# Patient Record
Sex: Female | Born: 1980 | Race: White | Hispanic: No | Marital: Single | Smoking: Current every day smoker
Health system: Southern US, Community
[De-identification: ages and names within clinical notes are randomized; demographics above are authoritative.]

## PROBLEM LIST (undated history)

## (undated) DIAGNOSIS — F319 Bipolar disorder, unspecified: Secondary | ICD-10-CM

## (undated) DIAGNOSIS — R569 Unspecified convulsions: Secondary | ICD-10-CM

## (undated) DIAGNOSIS — F419 Anxiety disorder, unspecified: Secondary | ICD-10-CM

## (undated) DIAGNOSIS — B1920 Unspecified viral hepatitis C without hepatic coma: Secondary | ICD-10-CM

## (undated) HISTORY — PX: TUBAL LIGATION: SHX77

## (undated) HISTORY — PX: TONSILLECTOMY: SUR1361

---
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
## 2000-05-18 ENCOUNTER — Encounter (INDEPENDENT_AMBULATORY_CARE_PROVIDER_SITE_OTHER): Payer: Self-pay | Admitting: *Deleted

## 2000-05-18 ENCOUNTER — Ambulatory Visit (HOSPITAL_BASED_OUTPATIENT_CLINIC_OR_DEPARTMENT_OTHER): Admission: RE | Admit: 2000-05-18 | Discharge: 2000-05-19 | Payer: Self-pay | Admitting: *Deleted

## 2001-03-29 ENCOUNTER — Ambulatory Visit (HOSPITAL_COMMUNITY): Admission: RE | Admit: 2001-03-29 | Discharge: 2001-03-29 | Payer: Self-pay | Admitting: Family Medicine

## 2001-03-29 ENCOUNTER — Encounter: Payer: Self-pay | Admitting: Family Medicine

## 2001-04-27 ENCOUNTER — Other Ambulatory Visit: Admission: RE | Admit: 2001-04-27 | Discharge: 2001-04-27 | Payer: Self-pay | Admitting: *Deleted

## 2001-04-27 ENCOUNTER — Other Ambulatory Visit: Admission: RE | Admit: 2001-04-27 | Discharge: 2001-04-27 | Payer: Self-pay | Admitting: Family Medicine

## 2001-06-29 ENCOUNTER — Encounter: Payer: Self-pay | Admitting: Family Medicine

## 2001-06-29 ENCOUNTER — Ambulatory Visit (HOSPITAL_COMMUNITY): Admission: RE | Admit: 2001-06-29 | Discharge: 2001-06-29 | Payer: Self-pay | Admitting: Family Medicine

## 2001-09-02 ENCOUNTER — Encounter: Payer: Self-pay | Admitting: Family Medicine

## 2001-09-02 ENCOUNTER — Inpatient Hospital Stay (HOSPITAL_COMMUNITY): Admission: AD | Admit: 2001-09-02 | Discharge: 2001-09-04 | Payer: Self-pay | Admitting: Family Medicine

## 2001-09-12 ENCOUNTER — Inpatient Hospital Stay (HOSPITAL_COMMUNITY): Admission: AD | Admit: 2001-09-12 | Discharge: 2001-09-12 | Payer: Self-pay | Admitting: Family Medicine

## 2001-10-07 ENCOUNTER — Encounter: Payer: Self-pay | Admitting: Family Medicine

## 2001-10-07 ENCOUNTER — Inpatient Hospital Stay (HOSPITAL_COMMUNITY): Admission: AD | Admit: 2001-10-07 | Discharge: 2001-10-11 | Payer: Self-pay | Admitting: Family Medicine

## 2001-10-09 ENCOUNTER — Encounter: Payer: Self-pay | Admitting: Obstetrics and Gynecology

## 2001-10-12 ENCOUNTER — Encounter: Admission: RE | Admit: 2001-10-12 | Discharge: 2001-11-11 | Payer: Self-pay | Admitting: Family Medicine

## 2001-11-16 ENCOUNTER — Other Ambulatory Visit: Admission: RE | Admit: 2001-11-16 | Discharge: 2001-11-16 | Payer: Self-pay | Admitting: Family Medicine

## 2002-11-24 ENCOUNTER — Emergency Department (HOSPITAL_COMMUNITY): Admission: EM | Admit: 2002-11-24 | Discharge: 2002-11-24 | Payer: Self-pay | Admitting: Emergency Medicine

## 2003-09-30 ENCOUNTER — Emergency Department (HOSPITAL_COMMUNITY): Admission: EM | Admit: 2003-09-30 | Discharge: 2003-09-30 | Payer: Self-pay | Admitting: Emergency Medicine

## 2003-10-06 ENCOUNTER — Ambulatory Visit (HOSPITAL_COMMUNITY): Admission: RE | Admit: 2003-10-06 | Discharge: 2003-10-06 | Payer: Self-pay | Admitting: Family Medicine

## 2003-10-13 ENCOUNTER — Ambulatory Visit (HOSPITAL_COMMUNITY): Admission: RE | Admit: 2003-10-13 | Discharge: 2003-10-13 | Payer: Self-pay | Admitting: Family Medicine

## 2003-12-11 ENCOUNTER — Other Ambulatory Visit: Admission: RE | Admit: 2003-12-11 | Discharge: 2003-12-11 | Payer: Self-pay | Admitting: Family Medicine

## 2004-01-08 ENCOUNTER — Ambulatory Visit (HOSPITAL_COMMUNITY): Admission: RE | Admit: 2004-01-08 | Discharge: 2004-01-08 | Payer: Self-pay | Admitting: Family Medicine

## 2004-03-25 ENCOUNTER — Inpatient Hospital Stay (HOSPITAL_COMMUNITY): Admission: AD | Admit: 2004-03-25 | Discharge: 2004-03-27 | Payer: Self-pay | Admitting: Family Medicine

## 2004-03-30 ENCOUNTER — Inpatient Hospital Stay (HOSPITAL_COMMUNITY): Admission: AD | Admit: 2004-03-30 | Discharge: 2004-04-08 | Payer: Self-pay | Admitting: Family Medicine

## 2004-06-13 ENCOUNTER — Ambulatory Visit: Payer: Self-pay | Admitting: Obstetrics and Gynecology

## 2004-07-10 ENCOUNTER — Ambulatory Visit: Payer: Self-pay | Admitting: Obstetrics & Gynecology

## 2004-07-10 ENCOUNTER — Ambulatory Visit (HOSPITAL_COMMUNITY): Admission: RE | Admit: 2004-07-10 | Discharge: 2004-07-10 | Payer: Self-pay | Admitting: Obstetrics and Gynecology

## 2004-09-19 ENCOUNTER — Emergency Department (HOSPITAL_COMMUNITY): Admission: EM | Admit: 2004-09-19 | Discharge: 2004-09-19 | Payer: Self-pay | Admitting: *Deleted

## 2004-10-21 ENCOUNTER — Inpatient Hospital Stay (HOSPITAL_COMMUNITY): Admission: EM | Admit: 2004-10-21 | Discharge: 2004-10-22 | Payer: Self-pay | Admitting: Emergency Medicine

## 2004-11-21 ENCOUNTER — Emergency Department (HOSPITAL_COMMUNITY): Admission: EM | Admit: 2004-11-21 | Discharge: 2004-11-21 | Payer: Self-pay | Admitting: Emergency Medicine

## 2004-12-28 ENCOUNTER — Emergency Department (HOSPITAL_COMMUNITY): Admission: EM | Admit: 2004-12-28 | Discharge: 2004-12-28 | Payer: Self-pay | Admitting: Emergency Medicine

## 2005-02-19 ENCOUNTER — Other Ambulatory Visit: Admission: RE | Admit: 2005-02-19 | Discharge: 2005-02-19 | Payer: Self-pay | Admitting: Obstetrics and Gynecology

## 2005-06-14 ENCOUNTER — Emergency Department (HOSPITAL_COMMUNITY): Admission: EM | Admit: 2005-06-14 | Discharge: 2005-06-14 | Payer: Self-pay | Admitting: Emergency Medicine

## 2005-06-16 ENCOUNTER — Emergency Department (HOSPITAL_COMMUNITY): Admission: EM | Admit: 2005-06-16 | Discharge: 2005-06-16 | Payer: Self-pay | Admitting: Emergency Medicine

## 2005-06-17 ENCOUNTER — Ambulatory Visit (HOSPITAL_COMMUNITY): Admission: RE | Admit: 2005-06-17 | Discharge: 2005-06-17 | Payer: Self-pay | Admitting: *Deleted

## 2005-06-17 ENCOUNTER — Ambulatory Visit: Payer: Self-pay | Admitting: Family Medicine

## 2005-06-17 ENCOUNTER — Inpatient Hospital Stay (HOSPITAL_COMMUNITY): Admission: EM | Admit: 2005-06-17 | Discharge: 2005-06-19 | Payer: Self-pay | Admitting: *Deleted

## 2005-12-07 ENCOUNTER — Inpatient Hospital Stay (HOSPITAL_COMMUNITY): Admission: EM | Admit: 2005-12-07 | Discharge: 2005-12-10 | Payer: Self-pay | Admitting: Emergency Medicine

## 2006-03-28 ENCOUNTER — Emergency Department (HOSPITAL_COMMUNITY): Admission: EM | Admit: 2006-03-28 | Discharge: 2006-03-28 | Payer: Self-pay | Admitting: Emergency Medicine

## 2006-03-29 ENCOUNTER — Emergency Department (HOSPITAL_COMMUNITY): Admission: EM | Admit: 2006-03-29 | Discharge: 2006-03-29 | Payer: Self-pay | Admitting: Emergency Medicine

## 2006-04-27 ENCOUNTER — Ambulatory Visit: Payer: Self-pay | Admitting: Psychiatry

## 2006-04-27 ENCOUNTER — Inpatient Hospital Stay (HOSPITAL_COMMUNITY): Admission: RE | Admit: 2006-04-27 | Discharge: 2006-04-30 | Payer: Self-pay | Admitting: Psychiatry

## 2006-10-30 ENCOUNTER — Inpatient Hospital Stay (HOSPITAL_COMMUNITY): Admission: EM | Admit: 2006-10-30 | Discharge: 2006-11-01 | Payer: Self-pay | Admitting: Emergency Medicine

## 2007-02-12 ENCOUNTER — Emergency Department (HOSPITAL_COMMUNITY): Admission: EM | Admit: 2007-02-12 | Discharge: 2007-02-12 | Payer: Self-pay | Admitting: Emergency Medicine

## 2007-03-12 ENCOUNTER — Emergency Department (HOSPITAL_COMMUNITY): Admission: EM | Admit: 2007-03-12 | Discharge: 2007-03-13 | Payer: Self-pay | Admitting: Emergency Medicine

## 2007-04-11 ENCOUNTER — Emergency Department (HOSPITAL_COMMUNITY): Admission: EM | Admit: 2007-04-11 | Discharge: 2007-04-11 | Payer: Self-pay | Admitting: Emergency Medicine

## 2008-02-27 ENCOUNTER — Inpatient Hospital Stay (HOSPITAL_COMMUNITY): Admission: EM | Admit: 2008-02-27 | Discharge: 2008-03-02 | Payer: Self-pay | Admitting: *Deleted

## 2008-03-27 ENCOUNTER — Emergency Department (HOSPITAL_COMMUNITY): Admission: EM | Admit: 2008-03-27 | Discharge: 2008-03-27 | Payer: Self-pay | Admitting: Emergency Medicine

## 2008-04-21 ENCOUNTER — Emergency Department (HOSPITAL_COMMUNITY): Admission: EM | Admit: 2008-04-21 | Discharge: 2008-04-21 | Payer: Self-pay | Admitting: Emergency Medicine

## 2008-05-20 ENCOUNTER — Emergency Department (HOSPITAL_COMMUNITY): Admission: EM | Admit: 2008-05-20 | Discharge: 2008-05-20 | Payer: Self-pay | Admitting: Emergency Medicine

## 2008-07-28 ENCOUNTER — Emergency Department (HOSPITAL_COMMUNITY): Admission: EM | Admit: 2008-07-28 | Discharge: 2008-07-28 | Payer: Self-pay | Admitting: Emergency Medicine

## 2008-12-17 ENCOUNTER — Inpatient Hospital Stay (HOSPITAL_COMMUNITY): Admission: EM | Admit: 2008-12-17 | Discharge: 2008-12-18 | Payer: Self-pay | Admitting: Emergency Medicine

## 2009-02-14 ENCOUNTER — Emergency Department (HOSPITAL_COMMUNITY): Admission: EM | Admit: 2009-02-14 | Discharge: 2009-02-14 | Payer: Self-pay | Admitting: Emergency Medicine

## 2009-05-24 ENCOUNTER — Inpatient Hospital Stay (HOSPITAL_COMMUNITY): Admission: EM | Admit: 2009-05-24 | Discharge: 2009-05-25 | Payer: Self-pay | Admitting: Emergency Medicine

## 2010-03-01 ENCOUNTER — Emergency Department (HOSPITAL_COMMUNITY): Admission: EM | Admit: 2010-03-01 | Discharge: 2010-03-02 | Payer: Self-pay | Admitting: Emergency Medicine

## 2010-05-14 ENCOUNTER — Emergency Department (HOSPITAL_COMMUNITY)
Admission: EM | Admit: 2010-05-14 | Discharge: 2010-05-14 | Payer: Self-pay | Source: Home / Self Care | Admitting: Emergency Medicine

## 2010-05-15 ENCOUNTER — Ambulatory Visit: Payer: Self-pay | Admitting: Psychiatry

## 2010-05-15 ENCOUNTER — Inpatient Hospital Stay (HOSPITAL_COMMUNITY)
Admission: AD | Admit: 2010-05-15 | Discharge: 2010-05-19 | Payer: Self-pay | Source: Home / Self Care | Admitting: Psychiatry

## 2010-05-15 ENCOUNTER — Emergency Department (HOSPITAL_COMMUNITY): Admission: EM | Admit: 2010-05-15 | Discharge: 2010-05-15 | Payer: Self-pay | Admitting: Emergency Medicine

## 2010-05-15 ENCOUNTER — Ambulatory Visit (HOSPITAL_COMMUNITY)
Admission: RE | Admit: 2010-05-15 | Discharge: 2010-05-15 | Payer: Self-pay | Source: Home / Self Care | Admitting: Psychiatry

## 2010-05-15 DIAGNOSIS — F411 Generalized anxiety disorder: Secondary | ICD-10-CM

## 2010-05-15 DIAGNOSIS — F1910 Other psychoactive substance abuse, uncomplicated: Secondary | ICD-10-CM

## 2010-09-03 LAB — DIFFERENTIAL
Basophils Absolute: 0 10*3/uL (ref 0.0–0.1)
Basophils Relative: 0 % (ref 0–1)
Eosinophils Absolute: 0.1 10*3/uL (ref 0.0–0.7)
Eosinophils Relative: 1 % (ref 0–5)

## 2010-09-03 LAB — URINALYSIS, ROUTINE W REFLEX MICROSCOPIC
Hgb urine dipstick: NEGATIVE
Protein, ur: NEGATIVE mg/dL
Urobilinogen, UA: 1 mg/dL (ref 0.0–1.0)

## 2010-09-03 LAB — CBC
Hemoglobin: 13.1 g/dL (ref 12.0–15.0)
MCH: 34.9 pg — ABNORMAL HIGH (ref 26.0–34.0)
RBC: 3.75 MIL/uL — ABNORMAL LOW (ref 3.87–5.11)

## 2010-09-03 LAB — COMPREHENSIVE METABOLIC PANEL
ALT: 43 U/L — ABNORMAL HIGH (ref 0–35)
AST: 32 U/L (ref 0–37)
Alkaline Phosphatase: 34 U/L — ABNORMAL LOW (ref 39–117)
CO2: 25 mEq/L (ref 19–32)
Chloride: 103 mEq/L (ref 96–112)
Creatinine, Ser: 0.88 mg/dL (ref 0.4–1.2)
GFR calc Af Amer: >60 mL/min (ref >60)
GFR calc non Af Amer: >60 mL/min (ref >60)
Sodium: 139 mEq/L (ref 135–145)
Total Bilirubin: 0.6 mg/dL (ref 0.3–1.2)

## 2010-09-03 LAB — URINE MICROSCOPIC-ADD ON

## 2010-09-03 LAB — RAPID URINE DRUG SCREEN, HOSP PERFORMED
Amphetamines: NOT DETECTED
Benzodiazepines: POSITIVE — AB
Tetrahydrocannabinol: POSITIVE — AB

## 2010-09-03 LAB — ACETAMINOPHEN LEVEL: Acetaminophen (Tylenol), Serum: <10 ug/mL — ABNORMAL LOW (ref 10–30)

## 2010-09-05 LAB — CBC
MCH: 34.7 pg — ABNORMAL HIGH (ref 26.0–34.0)
MCV: 98.7 fL (ref 78.0–100.0)
Platelets: 195 10*3/uL (ref 150–400)
RDW: 13.8 % (ref 11.5–15.5)

## 2010-09-05 LAB — BASIC METABOLIC PANEL
BUN: 17 mg/dL (ref 6–23)
CO2: 27 mEq/L (ref 19–32)
Chloride: 106 mEq/L (ref 96–112)
Creatinine, Ser: 0.82 mg/dL (ref 0.4–1.2)

## 2010-09-05 LAB — DIFFERENTIAL
Eosinophils Absolute: 0.2 10*3/uL (ref 0.0–0.7)
Eosinophils Relative: 2 % (ref 0–5)
Lymphs Abs: 2.6 10*3/uL (ref 0.7–4.0)
Monocytes Absolute: 0.9 10*3/uL (ref 0.1–1.0)

## 2010-09-24 LAB — CBC
HCT: 39.2 % (ref 36.0–46.0)
HCT: 40 % (ref 36.0–46.0)
HCT: 44 % (ref 36.0–46.0)
Hemoglobin: 13.8 g/dL (ref 12.0–15.0)
MCHC: 34 g/dL (ref 30.0–36.0)
MCV: 100.6 fL — ABNORMAL HIGH (ref 78.0–100.0)
MCV: 101.2 fL — ABNORMAL HIGH (ref 78.0–100.0)
Platelets: 149 10*3/uL — ABNORMAL LOW (ref 150–400)
Platelets: 149 10*3/uL — ABNORMAL LOW (ref 150–400)
RBC: 3.95 MIL/uL (ref 3.87–5.11)
RDW: 12.8 % (ref 11.5–15.5)
RDW: 13.1 % (ref 11.5–15.5)
WBC: 10.5 10*3/uL (ref 4.0–10.5)

## 2010-09-24 LAB — COMPREHENSIVE METABOLIC PANEL
Albumin: 3.7 g/dL (ref 3.5–5.2)
Alkaline Phosphatase: 35 U/L — ABNORMAL LOW (ref 39–117)
Alkaline Phosphatase: 36 U/L — ABNORMAL LOW (ref 39–117)
BUN: 5 mg/dL — ABNORMAL LOW (ref 6–23)
BUN: 9 mg/dL (ref 6–23)
CO2: 18 mEq/L — ABNORMAL LOW (ref 19–32)
Chloride: 111 mEq/L (ref 96–112)
Creatinine, Ser: 0.75 mg/dL (ref 0.4–1.2)
Creatinine, Ser: 0.77 mg/dL (ref 0.4–1.2)
GFR calc non Af Amer: >60 mL/min (ref >60)
Potassium: 3.7 mEq/L (ref 3.5–5.1)
Total Bilirubin: 1 mg/dL (ref 0.3–1.2)
Total Protein: 6.7 g/dL (ref 6.0–8.3)

## 2010-09-24 LAB — DIFFERENTIAL
Basophils Absolute: 0 10*3/uL (ref 0.0–0.1)
Basophils Relative: 0 % (ref 0–1)
Eosinophils Absolute: 0 10*3/uL (ref 0.0–0.7)
Eosinophils Relative: 1 % (ref 0–5)
Eosinophils Relative: 1 % (ref 0–5)
Lymphocytes Relative: 18 % (ref 12–46)
Lymphocytes Relative: 35 % (ref 12–46)
Lymphs Abs: 1.6 10*3/uL (ref 0.7–4.0)
Lymphs Abs: 2.3 10*3/uL (ref 0.7–4.0)
Monocytes Absolute: 0.6 10*3/uL (ref 0.1–1.0)
Monocytes Relative: 10 % (ref 3–12)
Neutro Abs: 3.4 10*3/uL (ref 1.7–7.7)
Neutro Abs: 7.6 10*3/uL (ref 1.7–7.7)

## 2010-09-24 LAB — PREGNANCY, URINE: Preg Test, Ur: NEGATIVE

## 2010-09-24 LAB — URINE MICROSCOPIC-ADD ON

## 2010-09-24 LAB — URINALYSIS, ROUTINE W REFLEX MICROSCOPIC
Nitrite: POSITIVE — AB
Specific Gravity, Urine: <1.005 — ABNORMAL LOW (ref 1.005–1.030)
Urobilinogen, UA: 0.2 mg/dL (ref 0.0–1.0)
pH: 5.5 (ref 5.0–8.0)

## 2010-09-24 LAB — RAPID URINE DRUG SCREEN, HOSP PERFORMED
Amphetamines: POSITIVE — AB
Barbiturates: NOT DETECTED

## 2010-09-24 LAB — BASIC METABOLIC PANEL
BUN: 10 mg/dL (ref 6–23)
CO2: 25 mEq/L (ref 19–32)
Chloride: 102 mEq/L (ref 96–112)
Glucose, Bld: 84 mg/dL (ref 70–99)
Potassium: 3.1 mEq/L — ABNORMAL LOW (ref 3.5–5.1)

## 2010-09-24 LAB — SALICYLATE LEVEL: Salicylate Lvl: <4 mg/dL (ref 2.8–20.0)

## 2010-09-30 LAB — DIFFERENTIAL
Eosinophils Absolute: 0.2 10*3/uL (ref 0.0–0.7)
Lymphocytes Relative: 17 % (ref 12–46)
Lymphs Abs: 3.1 10*3/uL (ref 0.7–4.0)
Monocytes Relative: 7 % (ref 3–12)
Neutro Abs: 13.1 10*3/uL — ABNORMAL HIGH (ref 1.7–7.7)
Neutrophils Relative %: 75 % (ref 43–77)

## 2010-09-30 LAB — CBC
MCV: 96.5 fL (ref 78.0–100.0)
Platelets: 184 10*3/uL (ref 150–400)
RBC: 4.26 MIL/uL (ref 3.87–5.11)
WBC: 17.6 10*3/uL — ABNORMAL HIGH (ref 4.0–10.5)

## 2010-09-30 LAB — BASIC METABOLIC PANEL
Chloride: 107 mEq/L (ref 96–112)
Creatinine, Ser: 0.61 mg/dL (ref 0.4–1.2)
GFR calc Af Amer: >60 mL/min (ref >60)
GFR calc non Af Amer: >60 mL/min (ref >60)
Potassium: 3.9 mEq/L (ref 3.5–5.1)

## 2010-10-31 ENCOUNTER — Emergency Department (HOSPITAL_COMMUNITY): Payer: Self-pay

## 2010-10-31 ENCOUNTER — Emergency Department (HOSPITAL_COMMUNITY)
Admission: EM | Admit: 2010-10-31 | Discharge: 2010-10-31 | Disposition: A | Discharge status: Home / Self Care | Payer: Self-pay | Attending: Emergency Medicine | Admitting: Emergency Medicine

## 2010-10-31 DIAGNOSIS — S9030XA Contusion of unspecified foot, initial encounter: Secondary | ICD-10-CM | POA: Insufficient documentation

## 2010-10-31 DIAGNOSIS — L02619 Cutaneous abscess of unspecified foot: Secondary | ICD-10-CM | POA: Insufficient documentation

## 2010-10-31 DIAGNOSIS — Y92009 Unspecified place in unspecified non-institutional (private) residence as the place of occurrence of the external cause: Secondary | ICD-10-CM | POA: Insufficient documentation

## 2010-10-31 DIAGNOSIS — L03119 Cellulitis of unspecified part of limb: Secondary | ICD-10-CM | POA: Insufficient documentation

## 2010-10-31 DIAGNOSIS — IMO0002 Reserved for concepts with insufficient information to code with codable children: Secondary | ICD-10-CM | POA: Insufficient documentation

## 2010-11-05 NOTE — H&P (Signed)
NAME:  [name], Soila                  ACCOUNT NO.:  [id number]   MEDICAL RECORD NO.:  [id number]          PATIENT TYPE:  INP   LOCATION:  5037                         FACILITY:  [facility]   PHYSICIAN:  Lonia Blood, M.D.      DATE OF BIRTH:  05-13-1981   DATE OF ADMISSION:  10/29/2006  DATE OF DISCHARGE:                              HISTORY & PHYSICAL   PRIMARY CARE PHYSICIAN:  Patient is unassigned.  Her primary care  physician is Dr. Bunnie Pion at [place], [place].   PRESENTING COMPLAINT:  Pain and swelling in the left groin area.   HISTORY OF PRESENT ILLNESS:  Patient is a 30 year old female with prior  history of MRSA, cellulitis and abscess involving multiple locations.  Her last admission here was in June 2007 where she had a MRSA abscess to  her chin that was excised and drained.  It was, at the time, thought to  be community-acquired MRSA.  Patient returned today secondary to  recurrent swelling, redness and pain in her left groin area just above  her vulva.  She was seen in the ED and evaluated.  A bedside ultrasound  showed no abscess to be drained.  She, however, has evidence of another  cellulitis, hence she has been admitted.  Patient did have some fever  and mild chills at home but not currently.  Denied any shaving or any  insect bite in the area.   PAST MEDICAL HISTORY:  Significant for bipolar disorder, history of  polysubstance abuse status post detoxification in December 2007.  Tobacco abuse and history of prior MRSA abscesses.   ALLERGIES:  NO KNOWN DRUG ALLERGIES.   MEDICATIONS:  Include Seroquel 300 mg p.o. nightly, Requip 1 mg p.o.  nightly, Cymbalta 60 mg p.o. nightly, hydrocodone 5/500 mg one tablet  q.6 h. p.r.n.   SOCIAL HISTORY:  Patient lives in [name].  She has one child, smokes  1 pack per day, social alcohol, denied any IV drug use now since her  detoxification.   FAMILY HISTORY:  Patient had a sister that died from drug overdose nine  years ago but no history of diabetes or hypertension.   REVIEW OF SYSTEMS:  A 12-point review of systems was performed and is  negative except for HPI.   PHYSICAL EXAMINATION:  VITAL SIGNS:  On exam temperature 97.9, blood  pressure 119/77, pulse 88, respiratory rate 18, sats 99% on room air.  GENERAL:  She is awake, alert, oriented, in no acute distress.  Very  pleasant woman.  HEENT:  PERRL, EOMI.  NECK:  Is supple.  No JVD.  No lymphadenopathy.  RESPIRATORY:  Good air entry bilaterally.  No wheezes.  No rales.  CARDIOVASCULAR SYSTEM:  Shows S1 and S2.  No murmurs.  ABDOMEN:  Soft, nontender.  Positive bowel sounds.  EXTREMITIES:  No edema, cyanosis or clubbing.  GENITOURINARY:  Patient's left groin area showed a 5 x 6 cm induration,  red, tender with surrounding redness in the groin.  No evidence of  abscess.  No fluctuance.   LABORATORY DATA:  White count 11,  hemoglobin 11.6, platelet count 206  with no left shift.   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   PLAN:  1. Left groin cellulitis.  We will admit the patient and start IV      vancomycin.  At this point she has no fluctuance and no evidence of      an abscess, but if it develops along the line, we will proceed to      get I&D done.  We will get blood cultures.  If it is thought to be      MRSA again, if it is community-acquired MRSA, we will discharge her      home on oral antibiotics in the next few days.  2. Bipolar disorder.  We will continue with her home medications      without change.  3. Tobacco abuse.  I will get her tobacco cessation counseling as well      as nicotine patch.  4. Prior history of polysubstance abuse.  Check urine drug screen      again.  The patient was said to have been clean.  5. Normocytic anemia.   This is mild in a young menstruating female.      Probably normal for age.      Lonia Blood, M.D.  Electronically Signed     LG/MEDQ  D:  10/30/2006  T:  10/30/2006  Job:  [id number]

## 2010-11-05 NOTE — Discharge Summary (Signed)
NAME:  [name], BRILL                  ACCOUNT NO.:  [id number]   MEDICAL RECORD NO.:  [id number]          PATIENT TYPE:  INP   LOCATION:  5037                         FACILITY:  [facility]   PHYSICIAN:  Lonia Blood, M.D.       DATE OF BIRTH:  04-14-1981   DATE OF ADMISSION:  10/29/2006  DATE OF DISCHARGE:  11/01/2006                               DISCHARGE SUMMARY   PRIMARY CARE PHYSICIAN:  Dr. Charm Barges in [place], [place], which  makes the patient unassigned for [facility].   DISCHARGE DIAGNOSES:  1. Methicillin-resistant Staphylococcus aureus cellulitis of the left      groin and left labia major -- improving.  2. History of polysubstance abuse.  3. Tobacco abuse.  4. Bipolar disorder.   DISCHARGE MEDICATIONS:  1. Seroquel 300 mg at bedtime.  2. Requip 1 mg at bedtime.  3. Cymbalta 60 mg at bedtime.  4. Septra 2 tablets double strength twice a day for 1 week.  5. Ibuprofen 400 mg 2 times a day.  6. Tylenol 500 mg 3 times a day.  7. Dilaudid 2 mg every 8 hrs as needed for severe pain   CONDITION ON DISCHARGE:  The patient was discharged in good condition.  At the time of discharge the patient was instructed to follow up with  her primary care physician for fcheck up on healing of the abscess.   PROCEDURES DURING THIS ADMISSION:  Soft tissue ultrasound was performed  in the emergency room of the area where the cellulitis was with no  finding of fluid underneath the skin.   CONSULTATIONS:  No consultations were obtained.   HISTORY AND PHYSICAL:  For dictated history and physical, refer to  dictation done by Dr. Mikeal Hawthorne, Oct 30, 2006.   HOSPITAL COURSE:  Problem #1:  Methicillin-resistant Staphylococcus  aureus cellulitis of the left groin and left labia major.  The patient  was admitted to [facility].  She had blood cultures and would  culture obtained.  The blood cultures were negative.  The wound culture  grew Methicillin-resistant Staphylococcus  aureus with a pattern of  resistance suggesting community acquired MRSA.  The patient was treated  with intravenous vancomycin from May 9 until Oct 31, 2006, and then was  switched to oral Septra.  She continued to improve on oral antibiotics  and we made the decision to discharge her to home on 11/01/2006 to  follow up with her primary care physician.   Problem #2:  Bipolar disorder.  The patient has been euthymic during  this hospitalization on her Seroquel, Requip and Cymbalta.      Lonia Blood, M.D.  Electronically Signed     SL/MEDQ  D:  11/01/2006  T:  11/01/2006  Job:  [id number]

## 2010-11-05 NOTE — Discharge Summary (Signed)
NAME:  [name], [name]                  ACCOUNT NO.:  [id number]   MEDICAL RECORD NO.:  [id number]          PATIENT TYPE:  INP   LOCATION:  5148                         FACILITY:  [facility]   PHYSICIAN:  Hillery Aldo, M.D.   DATE OF BIRTH:  Oct 03, 1980   DATE OF ADMISSION:  02/27/2008  DATE OF DISCHARGE:  03/02/2008                               DISCHARGE SUMMARY   PRIMARY CARE PHYSICIAN:  None.   DISCHARGE DIAGNOSES:  1. Periorbital cellulitis, cultures positive for methicillin-sensitive      Staphylococcus aureus.  2. Cocaine abuse.  3. Tobacco abuse.  4. Macrocytosis.   DISCHARGE MEDICATIONS:  1. Doxycycline 100 mg b.i.d. x10 days.  2. Percocet 1-2 tablets q.6 h. p.r.n. pain #20 written for.   CONSULTATION:  Pasty Spillers. Maple Hudson, MD of Ophthalmology.   BRIEF ADMISSION HISTORY OF PRESENT ILLNESS:  The patient is a 30-year-  old female with a history of MRSA in the past, who presented to the  hospital with a chief complaint of left eye periorbital swelling after  plucking her eyebrows.  She also reported some chills and was therefore  admitted for further evaluation and workup.  For full details, please  see the dictated report done by Dr. Ardyth Harps.   PROCEDURES AND DIAGNOSTIC STUDIES:  CT scan of the orbits/temporal area  with contrast on February 27, 2008 showed left-sided  periorbital/preseptal cellulitis with small abscesses.  No  postseptal/orbital involvement.  There was associated surrounding  cellulitis extending up into the forehead area.  Scattered ethmoid sinus  disease.   DISCHARGE LABORATORY VALUES:  Final culture of abscess revealed  Staphylococcus aureus, oxacillin sensitive.  Blood cultures were  negative.  Urine drug screen was positive for cocaine and opiates.   HOSPITAL COURSE:  1. Left periorbital cellulitis:  The patient was seen and consultation      arranged with Dr. Maple Hudson of Ophthalmology.  Dr. Maple Hudson did perform a      bedside incision and drainage, put a  small drain in the indurated      area above her left eye.  Cultures were sent with findings as noted      above.  Recommendations were to discharge home on p.o. antibiotics      once the final sensitivity data was known.  The organism is broadly      sensitive and we will discharge her on additional 10 days of      doxycycline to complete a full 2-week course of therapy with      antibiotics.  She was initially treated with vancomycin and Zosyn.      The Zosyn was discontinued after the preliminary gram stain      revealed staph species.  She is instructed to keep the wound with a      dressing intact until the drainage stops and to wash her face 3      times a day with antibacterial soap.  2. History of polysubstance abuse:  The patient will be referred for      ADS counseling on discharge.  3. Tobacco abuse:  The  patient was counseled on cessation.  4. Macrocytosis:  The patient did have a macrocytosis without any      evidence of anemia.  No further diagnostic workup was undertaken at      this time.   DISPOSITION:  The patient is medically stable for discharge.  She will  be discharged to home with instructions to call [phone] to obtain a  referral to a primary care physician.      Hillery Aldo, M.D.  Electronically Signed     CR/MEDQ  D:  03/02/2008  T:  03/03/2008  Job:  [id number]

## 2010-11-05 NOTE — H&P (Signed)
NAME:  [name], [name]                  ACCOUNT NO.:  [id number]   MEDICAL RECORD NO.:  [id number]          PATIENT TYPE:  INP   LOCATION:  5148                         FACILITY:  [facility]   PHYSICIAN:  Peggye Pitt, M.D. DATE OF BIRTH:  Jul 04, 1980   DATE OF ADMISSION:  02/27/2008  DATE OF DISCHARGE:                              HISTORY & PHYSICAL   PRIMARY CARE PHYSICIAN:  None.  She is assigned to [place].   CHIEF COMPLAINT:  Left eye pain and edema.   HISTORY OF PRESENT ILLNESS:  [name] is a 30 year old with history of  polysubstance abuse who is here today for left eye swelling and pain for  5 days.  She has no precedent trauma although she does remember plucking  her eyebrows 2 days prior.  She has had no fevers, but has had some  shaking chills.   ALLERGIES:  She has no known drug allergies.   PAST MEDICAL HISTORY:  Significant for polysubstance abuse according to  her chart, although she does deny this.   HOME MEDICATIONS:  None.   SOCIAL HISTORY:  She smokes 2 packs a day since age 37.  She drinks 2-3  beers every other day.  She denies any recent illicit drug use, although  does state she has used marijuana and crack cocaine in the past.  She is  single, has 2 children, lives with her friend.  Her mother has custody  of her 2 children.   FAMILY HISTORY:  Noncontributory.   REVIEW OF SYSTEMS:  Negative except as per HPI.   PHYSICAL EXAMINATION:  VITAL SIGNS:  Upon admission, blood pressure  122/73, heart rate 63, respirations 16, O2 sats 97% on room air with a  temperature of 97.6.  GENERAL:  Alert, awake, and oriented x3.  He does not appear to be in  acute distress.  HEENT:  She is normocephalic.  Her pupils are equal and reactive to  light and accommodation.  She has intact extraocular movements.  Her  left eye is erythematous and edematous and there was a scab over her  left eyebrow.  NECK:  Supple.  No JVD.  No lymphadenopathy.  No bruits or goiter.  CHEST:  Clear  to auscultation bilaterally.  HEART:  Regular rate and rhythm with no murmurs, rubs, or gallops.  ABDOMEN:  Soft, nontender, and nondistended with positive bowel sounds.  EXTREMITIES:  No edema and positive pulses.   LABORATORY:  Upon admission, sodium 142, potassium 4.3, chloride 102,  bicarb 34, BUN 5, creatinine 1.0, glucose of 92.  WBC is 11.3 with ANC  of 7.5, hemoglobin 14.5, and platelets of 180.  She had a CT scan that  showed preseptal cellulitis and a small abscess.   ASSESSMENT AND PLAN:  1. Preseptal cellulitis/abscess.  We will admit to MedSurg bed.  We      will start IV antibiotics consistent of      vancomycin and Unasyn.  We will start on blood cultures and have      called Ophthalmology to see her.  2. Prophylaxis.  Per gastrointestinal prophylaxis,  we will place him      on Protonix and per deep vein thrombosis prophylaxis, we will place      him on subcutaneous heparin.      Peggye Pitt, M.D.  Electronically Signed     EH/MEDQ  D:  02/27/2008  T:  02/28/2008  Job:  [id number]

## 2010-11-05 NOTE — H&P (Signed)
NAME:  [name], [name]                  ACCOUNT NO.:  [id number]   MEDICAL RECORD NO.:  [id number]          PATIENT TYPE:  EMS   LOCATION:  MAJO                         FACILITY:  [facility]   PHYSICIAN:  Antony Contras, MD     DATE OF BIRTH:  07/27/1980   DATE OF ADMISSION:  12/17/2008  DATE OF DISCHARGE:                              HISTORY & PHYSICAL   CHIEF COMPLAINT:  Facial infection.   HISTORY OF PRESENT ILLNESS:  The patient is a 30 year old white female  previously known to me from a prior chin abscess treatment who presented  to the emergency department with new onset of right facial, in fact a  right facial swelling and pain.  She started having right cheek pain  about 4 days ago and then 2 days ago started having more swelling around  a bad tooth on the right lower mandible.  It has worsened and pain is  now severe.  She feels it into her neck as well.  She has some dysphagia  but no difficulty breathing.   PAST MEDICAL HISTORY:  Anxiety, bipolar and history of MRSA.   MEDICATIONS:  None.   ALLERGIES:  No known drug allergies.   FAMILY HISTORY:  None.   SOCIAL HISTORY:  The patient is a former heavy drinker and did drink  alcohol this morning trying to numb her pain.  She denies drug abuse but  is a former drug user.  She does smoke cigarettes.   REVIEW OF SYSTEMS:  Negative except as listed above.   PHYSICAL EXAMINATION:  VITAL SIGNS:  Temperature of 98.9, blood pressure  107/74, pulse 80, respirations 16.  GENERAL:  The patient is in no acute distress and is pleasant and  cooperative, but appears somewhat sleepy and uncomfortable.  EYES:  Extraocular movements are intact.  Pupils are equal, round,  reactive to light.  The right upper and lower eyelid have mild edema.  NOSE:  External nose is normal.  Nasal passages are patent.  EARS:  External ears are normal.  External canals are patent with intact  tympanic membranes and aerated middle ear spaces.  HEAD AND FACE:   The right face is edematous and tender with the center  of this at the right anterior mandible.  Edema extends onto the neck as  well with some tenderness.  ORAL CAVITY/OROPHARYNX:  The lips are normal and the teeth appear in  fairly good condition.  There is a broken right lower canine tooth.  Adjacent to this is edema on the labial surface of the gingiva.  The  tongue and floor of mouth and buccal mucosa is otherwise normal.  Oropharynx is normal.  NECK:  The right mandibular swelling extends onto the right neck with  some tenderness.  There is no other tenderness or deformity.  LYMPHATICS:  Cervical lymph nodes are normal to palpation.  THYROID:  Normal to palpation.  SALIVARY GLANDS:  Normal palpation.  NEUROLOGIC:  Cranial nerves II-XII grossly intact.   LABORATORY DATA:  White blood count 17.6 with 75% neutrophils.   RADIOLOGIC EXAMINATION:  A  neck CT with contrast was personally  reviewed.  This demonstrates a 14 mm fluid collection adjacent to the  broken canine on the labial surface of the mandible with rim  enhancement.  There is surrounding soft tissue stranding but no other  fluid collection is seen on the scan.   ASSESSMENT:  The patient is a 30 year old white female with a right  perimandibular odontogenic abscess.   PLAN:  The abscess will be drained via an incision in the emergency  department under local anesthesia.  A Penrose drain will be placed.  Due  to her high white blood count and swelling in her face up to her eye,  she will be admitted to the hospital for intravenous antibiotic therapy  which will be clindamycin.  She will be given pain control as well.  Assuming she progresses on the IV antibiotic, she will likely be able to  be discharged tomorrow on oral clindamycin to follow up with an oral  surgeon for dental extraction.      Antony Contras, MD  Electronically Signed     Antony Contras, MD  Electronically Signed    DDB/MEDQ  D:  12/17/2008   T:  12/17/2008  Job:  [id number]

## 2010-11-05 NOTE — Op Note (Signed)
NAME:  [name], [name]                  ACCOUNT NO.:  [id number]   MEDICAL RECORD NO.:  [id number]          PATIENT TYPE:  INP   LOCATION:  5011                         FACILITY:  [facility]   PHYSICIAN:  Antony Contras, MD     DATE OF BIRTH:  Nov 10, 1980   DATE OF PROCEDURE:  12/17/2008  DATE OF DISCHARGE:                               OPERATIVE REPORT   PREOPERATIVE DIAGNOSIS:  Right perimandibular abscess.   POSTOPERATIVE DIAGNOSIS:  Right perimandibular abscess.   PROCEDURE:  Incision and drainage of right perimandibular abscess.   SURGEON:  Christia Reading, MD   ANESTHESIA:  Local.   COMPLICATIONS:  None.   INDICATIONS:  The patient is a 30 year old white female with a 4-day  history of right facial pain and a 2-day history of right perimandibular  swelling and tenderness, and a CT scan demonstrates a perimandibular  abscess associated with a broken right lower canine tooth.  Incision and  drainage is being performed.   FINDINGS:  Upon incision, there was not an obvious flow of pus, although  the blood that drained was cloudy in nature.  She did have a little bit  of pus in her mouth prior to making the incision, and she may have had  some spontaneous drainage.  A quarter-inch Penrose drain was placed.   DESCRIPTION OF PROCEDURE:  The patient was identified in the emergency  department and informed consent was obtained including discussion of  risks, benefits, and alternatives.  The patient was given intravenous  Dilaudid prior to the procedure.  The right gingivobuccal sulcus was  injected with 1% lidocaine with 1:100,000 epinephrine.  An incision was  made with a 11-blade scalpel through the mucosa and underlying soft  tissues.  A hemostat was then used to probe the wound along the  mandibular surface down to the inferior part of the mandible.  A quarter-  inch Penrose drain was then placed into the wound and secured at the  mucosal surface using 3-0 nylon suture.  The drain was  trimmed, and the  patient was then returned back to the emergency room care without  complication.      Antony Contras, MD  Electronically Signed     Antony Contras, MD  Electronically Signed   DDB/MEDQ  D:  12/17/2008  T:  12/17/2008  Job:  [id number]

## 2010-11-08 NOTE — Discharge Summary (Signed)
NAME:  [name], [name]                  ACCOUNT NO.:  [id number]   MEDICAL RECORD NO.:  [id number]          PATIENT TYPE:  INP   LOCATION:  9103                          FACILITY:  [facility]   PHYSICIAN:  Magnus Sinning. Rice, M.D. DATE OF BIRTH:  Jan 16, 1981   DATE OF ADMISSION:  03/30/2004  DATE OF DISCHARGE:  04/08/2004                                 DISCHARGE SUMMARY   DISCHARGE DIAGNOSES:  1.  Gravida 2 para 0-1-0-1.  2.  Preterm delivery at 31 weeks 5 days.  3.  Depression.  4.  Bipolar disorder.  5.  History of substance abuse.   DISCHARGE MEDICATIONS:  Motrin 600 mg three times a day with food p.r.n.  pain or cramping.   DISCHARGE INSTRUCTIONS:  The patient discharged to home with follow-up in 2  weeks to recheck medications and 6 weeks for postpartum checkup.   HISTORY AND PHYSICAL:  This is a 30 year old G2 P0-1-0-1 at 30 weeks 5 days  by a first trimester ultrasound that had the onset of preterm labor at  approximately 30 weeks.  She was admitted briefly on March 24, 2004, had a  positive fetal fibronectin then, received antibiotics, betamethasone, had  decreased contractions with no change in her dilation, was sent home on bed-  and pelvic rest with terbutaline for tocolysis on March 27, 2004.  A  recheck in the office on the day before admission showed, again, no change  in cervical dilation or station.  Today, on the day of admission, she had  increased contractions and vaginal pressure.  Past medical history  significant for depression, bipolar disorder, polysubstance abuse.  Her UDS  was noted to be negative at her last admission.  Past OB history:  In April  2003 she had a female 5 pounds 3 ounces at 36 weeks.  She had preterm labor  in that pregnancy and a spontaneous vaginal delivery.  Current medications:  Darvocet and Protonix.  Allergies:  None.  Prenatal labs:  A positive,  antibody negative, RPR nonreactive, rubella equivocal, HB surface antigen  negative, HIV  nonreactive, Pap within normal limits, GC and chlamydia  negative, and GBS negative x2.  Tetra screen was negative and 1-hour Glucola  was 51.  Social history:  Grandmother supportive.  Father of the baby  intermittently involved.  Tobacco use and a history of cocaine use, stopped  per the patient early in pregnancy.  Physical exam:  Blood pressure 119/62  at admission, normal exam except for vaginal exam showed 3 cm dilated, 80%  effaced, anterior cervix, -1 station, vertex presentation.  Fetal heart  tones were 125-130 with accelerations present, occasional mild variables.  Tocometry showed occasional contractions.   The patient was admitted for preterm labor with progressive cervical change.  She was placed on magnesium sulfate for tocolysis.  The patient was seen  also by the [facility] teaching service who agreed with care.  She was maintained on  the magnesium with a decrease in contractions until April 05, 2004.  She  began to have more symptoms of depression, was started on Lexapro.  Another  consult with the teaching service and decided to transition to Procardia  from the magnesium.  The patient was begun on that on April 05, 2004 and  by April 06, 2004 her cervical exam had changed to 5 cm and 80%.  She was  given a subcutaneous terbutaline shot which decreased contractions  transiently.  The NICU was notified that in all likelihood the patient would  progress towards delivery.  Contractions increased gradually and progressed  to delivery on April 06, 2004.  She had a normal vaginal delivery then,  attended by the pediatric team.  Apgars were 7 at one and 8 at five minutes.  Birth weight was 3 pounds 8.8 ounces.  The patient recovered well after her  delivery and was discharged home on April 08, 2004 with discharge  medications and instructions as listed above.     Kath   KMR/MEDQ  D:  05/08/2004  T:  05/08/2004  Job:  [id number]

## 2010-11-08 NOTE — H&P (Signed)
NAME:  [name], [name]                  ACCOUNT NO.:  [id number]   MEDICAL RECORD NO.:  [id number]          PATIENT TYPE:  INP   LOCATION:  5731                         FACILITY:  [facility]   PHYSICIAN:  Santiago Bumpers. Hensel, M.D.DATE OF BIRTH:  December 09, 1980   DATE OF ADMISSION:  06/17/2005  DATE OF DISCHARGE:                                HISTORY & PHYSICAL   CHIEF COMPLAINT:  Left groin pain and swelling.   HISTORY OF PRESENT ILLNESS:  [name] is a 30 year old female with history  of frequent skin infections or boils x4 during this year.  Last time in  October, involved the right groin.  These infections usually resolved with  intramuscular injection of Rocephin that is provided by her primary care  physician.  She now is presenting with left groin swelling and pain that  started December 23.  She cannot recall any skin injury in the area or in  the left extremity.  This lesion is different from prior skin infections in  that it is deeper, and there was not ingrown hear preceding as has been the  case before.  The patient was seen at [name] December 23.  She  was given intramuscular Rocephin and sent home and doxycycline 100 mg twice  daily, but swelling, pain, and redness continued to worsen, and she decided  to come to [facility] Emergency Department on December 25. Then she was  given IV Ancef.  She was sent home on Bactrim p.o. twice daily and Keflex  500 mg p.o. 4 times a day. She was told to return if erythema worsens or  increased above the dermatographic mark.  The patient's symptoms continued  to worsen with increased erythema, swelling, and excruciating pain.  She  also presented with generalized malaise and decreased p.o.  She decided to  come today to [facility].  Our team was requested to  admit the patient for IV antibiotic treatment. The patient also had a soft  tissue ultrasound that showed a fluid collection in the left groin with  concern for  abscess, but it seems to be deep enough to warrant surgical  evaluation.   REVIEW OF SYSTEMS:  Positive for malaise and chills but no fever.  The  patient denies nausea, vomiting, or diarrhea  No abrupt changes in her  weight.  Positive for anxiety.  The patient was did not have increased  hunger or thirst prior to these events.  She reports difficulty gaining  weight despite an appetite and eats as a big person.  No vaginal discharge.  No history of a STD.  She does not have cats or pets at home.  Her family  care [name] is Dr. Prudy Feeler, family doctor at [facility].   PAST MEDICAL HISTORY:  Significant for:  1.  Bipolar disorder diagnosed 2 years ago.  2.  Anxiety.  3.  Restless leg syndrome.  4.  GYN History: The patient is a G2, para 2-0-2-2.  No history of STD.      Hospital admission x2 for  preterm labor and delivery.  5.  Two days admission for pyelonephritis in April 2006.   PAST SURGICAL HISTORY:  1.  Tonsillectomy about 16 years ago.  2.  The patient also had a submandibular lymph node biopsy with no pathology      in 1998.   MEDICATIONS:  1.  Wellbutrin XR 150 mg p.o. daily.  2.  Lexapro 20 mg p.o. daily.  3.  Requip 2 mg p.o. nightly.  4.  Ambien 2 mg p.o. nightly p.r.n.  5.  Seroquel 600 mg p.o. nightly.  6.  Ativan 1 mg p.o. p.r.n.  7 . The patient is status post Rocephin intramuscular x1 and Ancef  intravenously x1.  1.  Bactrim twice daily for 1-1/2 days.  2.  Keflex 500 mg 4 times a day for 1-1/2 days.   FAMILY HISTORY:  Diabetes grandfather on the mother's side.  Mother is 49,  has fibromyalgia.  Father is 17 and is healthy.  She has step sister that  lived with her for 3 months earlier this year and had frequent boils in  skin.   SOCIAL HISTORY:  The patient finished high school.  She lives with her  mother.  She separated from the father of her two children about one year  ago.  She also lives with her step father and her two  children; one is 3 and  one is 1.  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  She smokes one pack of cigarettes per day for 9 years.  She has remote  history of occasional alcohol consumption.  She denies current or illicit  drug use.   OBJECTIVE:  VITAL SIGNS:  Temperature 97, heart rate 63, respirations 20,  blood pressure 108/63.  O2 saturation 99% on room air.  Weight 132 pounds.  [name]:  The patient is awake, alert. She is oriented x3.  She is not  obese. She is complaining of pain in her left groin.  HEENT: Pupils equal, round, and reactive to light and accommodation.  Extraocular movements are intact.  She has red lips and tongue, sticky  tongue, with no oral ulcers, and her oropharynx is clear.  NECK:  Supple.  No lymphadenopathy, no thyromegaly.  CARDIOVASCULAR:  Regular rate and rhythm with no murmurs, rubs, or gallops.  ABDOMEN:  Soft, nontender, nondistended.  Positive bowel sounds.  EXTREMITIES:  No lower extremity edema.  SKIN:  No __________  hemorrhages.  No skin rashes.  Left groin edema  extending superiorly 4 to 5 cm above the inguinal line in the left  suprapubic area.  Medially involving the left labia majora, also 5 cm below  the inguinal line down to the thigh, laterally 5 cm below the anterior  superior ileal spine.  There is an area of about 4 to 5 cm indurated mass  medial to the left inguinal line that is lateral to the pubis symphysis on  the left side.  Impressed  and touched tissue, but difficult to assess  secondary to extreme tenderness to palpation.  There are no draining  sinuses, no skin break.  There are no skin lesions or cuts or scratches on  the legs, arms, or torso.  There is no rash.  There is no other enlarged  lymph nodes.   LABORATORY DATA:  White blood cell count 11.6, hemoglobin 12.9, hematocrit 36.7, platelets 228.  Neutrophil account 64%, lymphocyte count 24%, absolute  neutrophil count 7.4.  Erythrosedimentation  rate is  27.  Sodium 139,  potassium 3.9, chloride 109, BUN 9, creatinine 0.9, glucose 101; pH 7.39,  PO2 38.8, bicarb 23.5.   Soft tissue ultrasound of the left groin shows complicated fluid collection  in the left groin concerning for an abscess.   ASSESSMENT AND PLAN:  A 30 year old female with history of frequent skin  superficial infections and bipolar disorder, admitted with worsening left  groin swelling, pain, and erythema for 4 days.  1.  Left groin mass and erythema.  Differential include cellulitis versus      abscess.  I think that both entities are involved.  The patient has a      history of skin parenchymatous .  She has failed treatment with multiple      antibiotics. That leaves a community-acquired methicillin-resistant      Staphylococcus aureus.  I will start IV vancomycin.  I am concerned for      a deep abscess.  There is a structure on the ultrasound below the fluid      collection that might represent a blood vessel.  I would prefer surgery      to evaluate as patient may need to go to the OR for incision and      drainage.  For pain, I will place the patient on IV Dilaudid 2 mg q.4h.      p.r.n. for now as p.o. Percocet was not really helping with her pain.  2.  Infectious disease.  Suspicious for methicillin-resistant Staphylococcus      aureus.  I will check blood cultures x 2, but patient is afebrile, and      vital signs are stable.  Unlikely there is a systemic infection.  Also      in differential is lymphogranuloma venereum.  Will check for chlamydia,      urine test, but likely since there is no vaginal discharge, no history      of sexually transmitted disease, and there are no draining sinuses      present.  There is no history of pet scratch, so Bartonella is down in      the differential.  I would not start treatment with azithromycin for      atypical now but will consider if no improvement.  The patient has taken      doxycycline already.  The  patient has been started on IV vancomycin.  3.  Bipolar disorder.  Currently stable. I will continue with her home      regimen.  4.  Endocrine. The patient has a history of polyphagia and polydipsia.  I      will check fasting CBG and hemoglobin A1c.  Also has history of anxiety      and may be episode of product of her bipolar disorder, but I would like      to check a TSH.  5.  Fluid, electrolytes, and nutrition.  I will keep patient n.p.o. for now      for possible surgical procedure.  If she will not have any procedures      done today, I would advance her diet and probably keep n.p.o. after      midnight if she is to have any surgical procedures tomorrow.  I will      start her on IV fluids D5 half normal saline at 100 mL per hour, and her      electrolytes are currently stable.  6.  Prophylaxis.  I will start patient on  Protonix 40 mg p.o. daily.      Activity ad lib.  DISPOSITION:  The patient is being admitted to a regular bed, and surgeon on  call has been consulted and will come to evaluate.      Sharin Grave, MD    ______________________________  Santiago Bumpers Leveda Anna, M.D.    AM/MEDQ  D:  06/17/2005  T:  06/17/2005  Job:  [id number]

## 2010-11-08 NOTE — H&P (Signed)
NAME:  [name], [name]                  ACCOUNT NO.:  [id number]   MEDICAL RECORD NO.:  [id number]          PATIENT TYPE:  EMS   LOCATION:  ED                            FACILITY:  [facility]   PHYSICIAN:  Vania Rea, M.D. DATE OF BIRTH:  [birth year]   DATE OF ADMISSION:  10/21/2004  DATE OF DISCHARGE:  LH                                HISTORY & PHYSICAL   PRIMARY CARE PHYSICIAN:  Magnus Sinning. Rice, M.D.   CHIEF COMPLAINT:  Back pain and fever.   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  and for the past week has been having burning  with micturition and suprapubic pain.  She self-medicated herself with over-  the-counter preparations but then started having left flank pain and  headache.  Yesterday, she spiked a temperature, and because her symptoms are  getting worse and unrelenting, she came to the emergency room this morning.  In the emergency room, she was found indeed to be febrile to have a  urinalysis suggestive of infection.  Her pain is unrelieved by simple  analgesics.  The hospitalist service is called to assist with management.  Denies previous history of UTI, or renal stones.   PAST MEDICAL HISTORY:  1.  Depression.   MEDICATIONS:  1.  Wellbutrin 75 mg daily.  2.  Lexapro 20 mg daily.  3.  Seroquel 25 mg p.r.n. insomnia.  4.  Ambien 10 mg at bedtime.   ALLERGIES:  No known drug allergies.   SOCIAL HISTORY:  Lives with her mother and stepfather.  She is the mother of  two children, ages 3 years and 6 months.  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  Denies alcohol or illegal drug use.  She has only one  sister, who died of a cocaine overdose.   FAMILY HISTORY:  Significant for a sister who died of a drug overdose,  fibromyalgia, and urinary tract infections.  No history of kidney stones.   REVIEW OF SYSTEMS:  Other  than noted in the admission history and physical,  10-point review of systems is negative.   PHYSICAL EXAMINATION:  GENERAL:  An ill-looking Caucasian lady lying in bed.  She describes her pain as a 9/10.  VITALS:  Temperature 100.5, pulse 112, respirations 20, blood pressure  129/83.  HEENT:  Pupils are equal, round and reactive.  Mucous membranes are dry.  CHEST:  Clear to auscultation.  CARDIOVASCULAR:  Tachycardic.  ABDOMEN:  She is tender in the suprapubic region and in the left flank.  EXTREMITIES:  She has 2+ pulses bilaterally.  She has no edema.  CENTRAL NERVOUS SYSTEM:  Grossly intact.   LABS:  White count 18,000, hematocrit 40.3, MCV 95, RDW 13.4, platelets 164.  Dilute granulocyte count is 16.3.  Her sodium is 138, potassium 3.6,  chloride 107, CO2 25, glucose 142, BUN 9, creatinine 0.9, calcium 8.5, total  protein 6.5, albumin 3.5, AST 14,  ALT 12, alk phos 48, total protein 0.7.  Urinalysis shows many bacteria, white cells too-numerous-to-count, 3-6 red  cells.  Nitrite is positive.  Leukocyte esterase small.   ASSESSMENT:  Acute pyelonephritis.  Depression, stable.   PLAN:  As per orders.      LC/MEDQ  D:  10/21/2004  T:  10/21/2004  Job:  [id number]

## 2010-11-08 NOTE — H&P (Signed)
NAME:  [name], [name]                  ACCOUNT NO.:  [id number]   MEDICAL RECORD NO.:  [id number]          PATIENT TYPE:  INP   LOCATION:  5731                         FACILITY:  [facility]   PHYSICIAN:  Sherin Quarry, MD      DATE OF BIRTH:  [birth year]   DATE OF ADMISSION:  12/07/2005  DATE OF DISCHARGE:  12/10/2005                                HISTORY & PHYSICAL   [name] is a 30 year old lady who has a previous history of  staphylococcal abscesses, who on Monday prior to admission noticed  progressive swelling of her face localized to her chin area.  She was  treated as an outpatient with oral Levaquin but continued to have swelling  and drainage from the chin.  In the emergency room, she was found to have  evidence of abscess.  A CT scan of her neck was obtained which showed  infection involving the soft tissue of the chin with abscess formation and  phlegmon.   PHYSICAL EXAMINATION:  Physical examination is performed by Dr. Renford Dills.  VITAL SIGNS:  The temperature was 99.1, blood pressure 117/66, pulse 71,  respirations 13.  HEENT:  Remarkable for the chin abscess.  The neck was somewhat tender in  the submandibular area on the right.  CHEST:  Clear.  There was no wheezing, no rhonchi.  CARDIOVASCULAR:  Normal S1 and S2.  The abdomen was nontender.  NEUROLOGIC:  Testing was within normal limits.   Laboratory studies obtained included a BMET which was normal.  CBC showed  white count 11,100, hemoglobin 15.1.   On December 08, 2005, Dr. Antony Contras took the patient to the OR and  performed an incision and drainage of the chin abscess.  Cultures from this  procedure grew a methicillin-resistant Staphylococcus which was sensitive to  trimethoprim sulfa, vancomycin and tetracycline.  Postoperatively, the  patient's antibiotic therapy was continued to consist of vancomycin 1 g IV  every 12 hours.  This was subsequently increased to 1 g every 8 hours and  Zosyn 3.375 g IV of 6  hours.  Dr. Jenne Pane continued to follow the patient  closely and on December 10, 2005, indicated that the patient was doing well and  that he did not need to continue to follow the patient from a surgical  standpoint.  By December 10, 2005, her chin looked very good.  There really was  not much swelling or residual redness.  Therefore, on December 10, 2005, the  patient was discharged.   DISCHARGE DIAGNOSES:  1.  Methicillin-resistant Staphylococcus facial abscess confined to chin.  2.  Bipolar disorder.   DISCHARGE MEDICATIONS:  She will continue her usual medicines which are  1.  Ambien 10 mg at bedtime.  2.  Seroquel 600 mg at bedtime.  3.  Ativan 1 mg p.r.n.  4.  Maxalt 1 tablet p.r.n. for migraine headache.  5.  She will also take doxycycline 100 mg every 12 hours for five additional      days.  6.  She was given a prescription for Tylox with instructions  to take one or      two every six hours p.r.n. for pain.  She was given 20 tablets.   FOLLOW UP:  Follow-up will be as per Dr. Jenne Pane and as per her primary doctor  which is [facility] [place]           ______________________________  Sherin Quarry, MD     SY/MEDQ  D:  12/10/2005  T:  12/10/2005  Job:  [id number]   cc:   [facility]  [place], [place]

## 2010-11-08 NOTE — Discharge Summary (Signed)
[facility]  Patient:    [name], [name] Visit Number: [id number] MRN: [id number]          Service Type: OBS Location: 910D [id number] 01 Attending Physician:  Bosie Clos Dictated by:   Montey Hora, M.D. Admit Date:  09/02/2001 Discharge Date: 09/04/2001                             Discharge Summary  DISCHARGE DIAGNOSES:          1. Intrauterine pregnancy at 30 weeks and                                  5 days.                               2. Preterm labor.  DISCHARGE MEDICATIONS:        1. Flagyl 500 mg p.o. b.i.d. x 7 days.                               2. Procardia XL 50 mg p.o. q.d.                               3. Ambien 10 mg one-half to one p.o. q.h.s.                                  p.r.n. insomnia.  HOSPITAL LABORATORY DATA:     Group B streptococcus negative.  Ultrasound for growth showed the cervix length at 2.1 cm, growth at the 35th percentile for 30-1/2 weeks, posterior placenta, cephalic presentation, and amniotic fluid index 11.9.  HISTORY OF PRESENT ILLNESS:   A 30 year old, G1 at 30-3/7 weeks, evaluated at the office today for a regular visit.  Found to have cervical dilatation. Complained of increased pelvic and rectal pressure for several days, but denied any contractions.  Pregnancy complications include Trichomonas and intermittent thoracic back pain.  Bacterial vaginosis found on wet prep in the office and group B streptococcus sent out to an alternate lab in the office.  OBSTETRICAL LABORATORY DATA:  A+.  RPR nonreactive.  Rubella immune.  HB surface antigen negative.  HIV negative.  GC and chlamydia negative.  MSAFP within normal limits.  Glucola and hemoglobin pending.  SOCIAL HISTORY:               Smokes a half of a pack per day.  PHYSICAL EXAMINATION:         Afebrile, BP 105/48, respiratory rate 18.  GENERAL APPEARANCE:           WD, WN.  NAD.  HEART:                        Significant for a 2/6 SEM flow  murmur and SVE.  PELVIC:                       Loose 1 cm, 70%, -1, vertex.  HOSPITAL COURSE:              The patient was admitted for observation and monitoring for preterm cervical  change.  The patient received Unasyn x 48 hours and Flagyl for the bacterial vaginosis.  Betamethasone was given. The patient was kept on bed rest.  After observation, it was found that the patient was contracting and not recognizing those contractions.  So she was started on Procardia for that.  An ultrasound showed that her cervical length had shortened to 2.1 cm.  She did well in the hospital with decreased pelvic pressure and back pain.  Group B streptococcus was found to be negative.  DISPOSITION:                  The patient was discharged on hospital day #2 with plans to keep her at bed and pelvic rest at home.  DISCHARGE MEDICATIONS:        Continue the Procardia.  Continue the Flagyl for the bacterial vaginosis.  FOLLOW-UP:                    Close follow-up to assess any cervical change in the office.  The case was discussed with Conni Elliot, M.D. Dictated by:   Montey Hora, M.D. Attending Physician:  Bosie Clos DD:  09/04/01 TD:  09/05/01 Job: [id number]

## 2010-11-08 NOTE — Consult Note (Signed)
NAME:  [name], [name]                  ACCOUNT NO.:  [id number]   MEDICAL RECORD NO.:  [id number]          PATIENT TYPE:  INP   LOCATION:  5731                         FACILITY:  [facility]   PHYSICIAN:  Antony Contras, MD     DATE OF BIRTH:  1980/11/03   DATE OF CONSULTATION:  12/07/2005  DATE OF DISCHARGE:                                   CONSULTATION   REQUESTING SERVICE:  [facility].   CHIEF COMPLAINT:  Chin infection.   HISTORY OF PRESENT ILLNESS:  The patient is a 30 year old white female who  developed chin and neck pain on Monday associated with right ear pain that  progressed to more swelling and pain through the week.  She was seen by a  physician on Wednesday, who started her on Levaquin; and she has noticed to  help her neck swelling.  Meanwhile, the chin swelling has continued and come  to a head and started to drain.  She presents to the Emergency Room today  with continued pain and drainage.   PAST MEDICAL HISTORY:  1.  The patient has a history of staph abscesses in her groin and gluteal      region in the last couple of years.  She does not recall a history of      MRSA.  2.  She is bipolar and has migraines.   PAST SURGICAL HISTORY:  1.  Tubal ligations.  2.  Submental lymph node biopsy.   MEDICATIONS:  Ambien, Seroquel, Ativan, and Maxalt.   ALLERGIES:  NO KNOWN DRUG ALLERGIES.   FAMILY HISTORY:  She has a sister who died of a drug overdose.   SOCIAL HISTORY:  The patient smoked a half a pack of cigarettes per day and  denies alcohol use.  She admits to occasional marijuana use.   REVIEW OF SYSTEMS:  Review of systems positive for pain with eating and  decreased appetite and fatigue.  All other systems are negative except as  listed above.   PHYSICAL EXAMINATION:  VITAL SIGNS:  Temperature 99.1, blood pressure  117/66, pulse of 71, respirations 18, saturations 100%.  GENERAL:  The patient is alert and in no acute distress.  She is  uncomfortable, however.  She is oriented.  HEENT:  Ears:  External ears, external auditory canals, tympanic membranes,  and middle ears are normal.  Nose:  Nasal passages are patent with no  exudate or mass.  Oral cavity/oropharynx:  Tongue is normal as is the floor  of the mouth.  Buccal mucosa appears normal.  She has no tenderness in her  teeth.  Oropharynx is normal and symmetric.  Face:  The patient has  fluctuant swelling of the chin, extending into the lower lip.  This is  tender to palpation, and there is a scab inferiorly where drainage has  occurred.  She points out a scar to the left on the lower part of her chin  and one in her submental region from the lymph node biopsy.  NECK:  The patient is tender in the anterior neck without edema.  There is  no significant lymph adenopathy that is palpable and no mass.  She has good  range of motion of the neck.  CRANIAL NERVES II-XII:  Intact.   LABORATORIES:  White blood count 11.1, hemoglobin 15.1, platelets 257,000.   RADIOLOGY:  Neck CT with contrast was personally reviewed.  This  demonstrates a fluid-filled space anterior to the mentum of the  mandible  with surrounding enhancement.  The remainder of the neck is unremarkable  except for slightly enlarged lymphadenopathy.  The bone of the mandible  appears to be normal without obvious dental abscess.   ASSESSMENT:  The patient is a 30 year old white female with abscess of the  soft tissues of the chin.  With her previous history of abscesses, these may  be linked.  Community-acquired methicillin-resistant Staphylococcus aureus  is a consideration.   RECOMMENDATIONS:  The patient has been admitted to the [facility] service  and has been started on vancomycin and Zosyn.  I agree with including  vancomycin for the possibility of MRSA.  Unfortunately, the patient has just  eaten dinner and, as such, will not be able to have surgery for at least 6  hours.  Thus, she will be  scheduled for incision and drainage of the chin  abscess under anesthesia tomorrow at about noon.  She is being made n.p.o.  after midnight.   Risks were discussed with the patient including bleeding, scar, and  recurrence of abscess.   A Penrose drain will be placed at the time of surgery to allow for further  drainage.  She will require intravenous antibiotics for a few days in order  to treat the surrounding soft tissue infection.      Antony Contras, MD  Electronically Signed     DDB/MEDQ  D:  12/07/2005  T:  12/08/2005  Job:  [id number]

## 2010-11-08 NOTE — Discharge Summary (Signed)
NAME:  [name], [name]                  ACCOUNT NO.:  [id number]   MEDICAL RECORD NO.:  [id number]          PATIENT TYPE:  INP   LOCATION:  5731                         FACILITY:  [facility]   PHYSICIAN:  Pearlean Brownie, M.D.DATE OF BIRTH:  [birth year]   DATE OF ADMISSION:  06/17/2005  DATE OF DISCHARGE:  06/19/2005                                 DISCHARGE SUMMARY   DISCHARGE DIAGNOSES:  1.  Left groin abscess/cellulitis.  2.  Bipolar disorder, stable during this hospital stay.  3.  Ascites, stable this hospital stay.  4.  Restless leg syndrome, stable during this admission.  5.  Polysubstance abuse.   DISCHARGE MEDICATIONS:  1.  Bactrim 160/800 p.o. b.i.d. to complete 14 days of antibiotics, take      until July 01, 2005.  2.  Dilaudid 2 mg tab q.4 h. p.r.n.  3.  Wellbutrin XR 150 mg p.o. daily.  4.  Lexapro 20 mg p.o. daily.  5.  Requip 2 mg p.o. at bedtime.  6.  Ambien 2 mg p.o. at bedtime.  7.  Seroquel 600 mg p.o. q.h.s.  8.  Ativan 1 mg p.o. p.r.n.   BRIEF HISTORY OF PRESENT ILLNESS:  [name] is a 30 year old female with a  history of prior frequent skin infections, which require multiple treatments  with Rocephin that was admitted with a 4-day history of increased swelling  and redness, and pain in the left groin diagnosed with an abscess.  Patient  has been status post several antibiotics including doxycycline for two days,  Rocephin one dose intramuscular, Ancef one dose IV, Bactrim p.o. for 1-1/2  days and Keflex for 1-1/2 days.  Symptoms were worsening and patient was  admitted for IV antibiotics and surgical incision and drainage.   ADMISSION LABORATORIES:  The patient had a white blood cell count of 11.6,  hemoglobin 12.9, hematocrit of 36.7, platelets 228.  She had a neutrophilic  count of 64%, a leukocytic count of 24%.  Her electrolytes were within  normal limits.  Patient had a soft tissue ultrasound of the left groin that  showed complicated fluid collection  in the left groin concerning for an  abscess.   HOSPITAL COURSE:  Problem 1. Left groin mass and erythema.  The patient was  initially treated with vancomycin.  She has received a total of four doses  of IV vancomycin during this hospital stay.  Her erythema almost completely  resolved.  On discharge day, she still had an induration and pain in the  left groin.  Her induration was slightly decreased and surgery was  consulted.  Dr. Tawanna Cooler, surgeon from [facility], was following patient.  They deferred surgical treatment and recommended to continue on medical  treatment, and recommended patient to be discharged today.  Their  perspective is that since patient was not febrile, white blood cell count  was trending down and erythema has resolved, and the induration has  decreased some they would prefer to continue with antibiotics, and if  patient's symptoms worsen, they would like to see her and their phone number  was provided,  and they would admit patient to their service for a definite  surgical treatment of this abscess.  On discharge day, patient was afebrile.  She has been afebrile for the length of her admission.  She still complained  of pain about 6/10 in her left groin, and her erythema was much improved.  She was able to get out of the bed to the bathroom with mild discomfort.  Her pain has been treated with IV Dilaudid.  Patient was on 4 mg IV q.4 h.  p.r.n., which was helping to control her pain.  She will be discharged on  oral Dilaudid 2 mg p.o. q.4 h. as needed.   Problem 2. Bipolar.  Remained stable on home regimen.   Problem 3. Substance abuse.  Patient has been provided with an ABS number  prior to discharge.   DISCHARGE LABORATORIES:  The patient has blood cultures that were negative  for 3 days x2.  She had a urine Chlamydia test that was negative.  The  patient had an HIV test that was negative.  Patient had a urine drug test  that was positive for marijuana,  positive for methadone, positive for  cocaine, and positive for opiates.  Opiates were given to patient prior to  the urine drug screen.  CBC on discharge day showed a white blood cell count  6.2, hemoglobin 10.9, hematocrit 31.6, platelets 219.   DISCHARGE INSTRUCTIONS:  Patient was instructed to use a warm compresses in  the left groin twice a day.  She also was instructed to call Dr. Tawanna Cooler at  [facility], number [phone] if any worsening of her symptoms.  [facility] will admit patient for definite incision and drainage of  her abscess if she fails medical treatment, and she will be admitted  directly through their services to their inpatient service.  Otherwise, if  she continues to improve she has a follow-up appointment with Dr. Johna [name]  at [facility] on July 03, 2005 at 11:15.  She has also been  instructed to follow up with her primary care [name], Dr. Prudy Feeler, as  needed.      Sharin Grave, MD    ______________________________  Pearlean Brownie, M.D.    AM/MEDQ  D:  06/19/2005  T:  06/19/2005  Job:  [id number]

## 2010-11-08 NOTE — Op Note (Signed)
NAME:  [name], [name]                  ACCOUNT NO.:  [id number]   MEDICAL RECORD NO.:  [id number]          PATIENT TYPE:  AMB   LOCATION:  SDC                           FACILITY:  [facility]   PHYSICIAN:  Lesly Dukes, M.D. DATE OF BIRTH:  1981-06-23   DATE OF PROCEDURE:  DATE OF DISCHARGE:                                 OPERATIVE REPORT   PREOPERATIVE DIAGNOSIS:  A 30 year old para 2 who desires permanent  sterilization.   POSTOPERATIVE DIAGNOSIS:  A 30 year old para 2 who desires permanent  sterilization.   PROCEDURE:  Laparoscopic bilateral tubal ligation.   SURGEON:  Lesly Dukes, M.D.   ANESTHESIA:  General anesthesia.   ESTIMATED BLOOD LOSS:  Minimal.   COMPLICATIONS:  None.   PATHOLOGY:  None.   FINDINGS:  Grossly normal uterus, ovaries and fallopian tubes; no evidence  of scar tissue or endometriosis.   DESCRIPTION OF PROCEDURE:  After informed consent was obtained, the patient  was taken to the operating room where general anesthesia was induced.  The  patient was placed in the dorsal lithotomy position and prepped and draped  in the normal sterile fashion.  The bladder was in and out catheterized.  A  bivalve speculum was placed in the patient's vagina and a Hulka clip was  placed on the cervix to aid in uterine mobilization.  Gown and gloves were  changed.  An infraumbilical skin incision  was made with scalpel and carried  down blunt to underlying layer of fascia.  The fascia was incised in the  midline and extended superior and inferiorly.  The peritoneum was entered  bluntly and area noted to be clear of bowel.  The Hasson trocar was placed  into the abdomen and a pneumoperitoneum was achieved with CO2.  The patient  was placed in Trendelenburg and the operative laparoscope was introduced  into the abdomen.  A survey of abdominal contents was performed with the  above findings.  The Kleppinger was then used to fulgurate each fallopian  tube three times.  There  was no bleeding.  At the end of the procedure the  pneumoperitoneum was released and instruments were removed from the abdomen.  The fascia was closed using 0 Vicryl and the skin was closed with 3-0 Vicryl  in a subcuticular fashion. The patient tolerated the procedure well.  Sponge, lap, instrument, and needle counts correct x2.  The patient went to  the recovery room in stable condition.     KHL/MEDQ  D:  07/10/2004  T:  07/10/2004  Job:  [id number]

## 2010-11-08 NOTE — Op Note (Signed)
[place]  Patient:    [name], [name]                           MRN: [id number] Proc. Date: 05/18/00 Adm. Date:  [id number] Attending:  Claudina Lick                           Operative Report  PREOPERATIVE DIAGNOSIS:  Chronic adenotonsillitis.  POSTOPERATIVE DIAGNOSIS:  Chronic adenotonsillitis.  PROCEDURE:  T&A.  SURGEON:  Dr. Garrison Columbus.  ANESTHESIA:  General.  PROCEDURE:  This 30 year old white female has had a history of chronic recurrent tonsillitis essentially all of her life, 3-4 episodes a year, associated with fever, and cervical adenitis.  Examination showed 2+ enlarged, very cryptic tonsils, moderate midline pad of adenoid tissue, bilaterally enlarged jugulodigastric nodes.  Patient admitted for surgery.  After satisfactory general endotracheal anesthesia had been induced, with the patient in the hyperextended position, a Jennings mouth gag was inserted and in retracting of the soft palate a moderate sized midline pad of adenoid tissue was removed with a curet and punch forceps.  Bleeding controlled with light cautery and packs following which the Crowe-Davis mouth gag was inserted and suspended from the Mayo stand and the 2+ enlarged very cryptic somewhat buried tonsils were excised by way of electrodissection with coagulation of the vessels.  ESTIMATED BLOOD LOSS:  Less than 25 cc.  The patient was given 1 gram of Ancef and 8 mg of Decadron IV intraoperatively.  The patient tolerated the procedure well and was awakened from anesthesia and taken to the recovery room in satisfactory condition. DD:  05/18/00 TD:  05/18/00 Job: [id number]

## 2010-11-08 NOTE — H&P (Signed)
NAME:  [name], [name]                  ACCOUNT NO.:  [id number]   MEDICAL RECORD NO.:  [id number]          PATIENT TYPE:  INP   LOCATION:  1830                         FACILITY:  [facility]   PHYSICIAN:  Deirdre Peer. Polite, M.D. DATE OF BIRTH:  Nov 19, 1980   DATE OF ADMISSION:  12/07/2005  DATE OF DISCHARGE:                                HISTORY & PHYSICAL   CHIEF COMPLAINT:  Facial pain.   HISTORY OF PRESENT ILLNESS:  A 30 year old female with known history of  Staph abscesses and bipolar, who presents to the ED with apparent abscess on  her chin.  According to the patient, she was okay until approximately  Monday, she noticed some swelling in her face, which progressively worsened  until it came to a head, it looks like a definite abscess on her chin, and  she noticed also swelling in her right submandibular area.  The patient  states that she saw a physician on an outpatient basis approximately  Wednesday of last week and started her on Levaquin.  Despite the above  antibiotic, the patient continues to have progressive swelling and now  having drainage from her chin and discomfort.  In the ED, the patient was  evaluated.  She has a low grade temp of 99.1, otherwise stable but has a  significant abscess.   LABS:  Significant for a white count of 11.1.   Patient has had a CT of the face.  Final results are pending.   [facility] has been called for further evaluation and treatment.  At  the time of my evaluation, the patient is still in some distress because of  pain and discomfort and as stated, has had abscesses in the past.  In  January, 2007 had one in her groin and in 2006, in the gluteal area.  She  does not recall having MRSA but does remember having been told there were  staph infections.  Admission is deemed necessary for further evaluation and  treatment.   PAST MEDICAL HISTORY:  1.  Significant for migraine headaches.  2.  Bipolar.  3.  History of staph abscess.   MEDICATIONS ON ADMISSION:  1.  Ambien 10 mg q.h.s.  2.  Seroquel 600 mg q.h.s.  3.  Ativan 1 mg as needed.  4.  Maxalt 1 tab p.r.n.   SOCIAL HISTORY:  Positive for tobacco one-half pack per day.  No alcohol.  Occasional drugs, marijuana.   PAST SURGICAL HISTORY:  Significant for tubal ligation in 2006.  The patient  admits to having a lymph node removed in a submandibular area in a 90 H  benign pathology.   ALLERGIES:  No known drug allergies.   FAMILY HISTORY:  Noncontributory.  Did have a sister who died of a drug  overdose approximately eight years ago.   PHYSICAL EXAMINATION:  VITALS:  Temp 99.1, BP 117/66, pulse 71, respiratory  rate 18.  Satting 100%.  GENERAL:  Patient is alert and oriented in moderate distress secondary to  facial pain from her chin abscess.  HEENT:  Significant for obvious abscess in the  chin area with erythema and  honey-colored drainage.  There are no oral lesions.  NECK:  The patient does have tenderness in the submandibular area,  significantly on the right.  No thyromegaly.  LUNGS:  Patient does not have any obvious wheeze or stridor.  Chest is clear  to auscultation bilaterally.  CARDIOVASCULAR:  Regular.  ABDOMEN:  Nontender.  NEUROLOGIC:  Nonfocal.   DATA:  White count 11.1, hemoglobin 15, platelets 257, neutrophil count 63%.  BMET within normal limits.   CT of the face pending.   ASSESSMENT:  1.  Facial abscess, rule out community-acquired methicillin-resistant      staphylococcus aureus.  2.  Bipolar.   Recommend patient be admitted to a medicine floor bed.  Patient will be  started on IV antibiotics.  Will be pan cultured.  Will obtain a CT to  evaluate the extension of the abscess process.  Patient will require ENT  consultation.  Will make further recommendations after review of the above  studies.      Deirdre Peer. Polite, M.D.     RDP/MEDQ  D:  12/07/2005  T:  12/07/2005  Job:  [id number]

## 2010-11-08 NOTE — Discharge Summary (Signed)
NAME:  [name], [name]                  ACCOUNT NO.:  [id number]   MEDICAL RECORD NO.:  [id number]          PATIENT TYPE:  IPS   LOCATION:  0306                          [facility]ACILITY[facility]  [facility]   PHYSICIAN:  Anselm Jungling, MD  DATE OF BIRTH:  February 20, 1981   DATE OF ADMISSION:  04/27/2006  DATE OF DISCHARGE:  04/30/2006                               DISCHARGE SUMMARY   IDENTIFYING DATA AND REASON FOR ADMISSION:  The patient is a 30 year old  single white female who came to [place] seeking detoxification from  polysubstance abuse and dependence.  She had been utilizing  benzodiazepines, opiates, heroin, THC, and cocaine.  She had had a  diagnosis of bipolar disorder in the past.  She was currently being  prescribed Symbyax, Ambien, Ativan, and Requip.  She had had no prior  history of medical detoxification or chemical dependency treatment.  Please refer to the admission note for further details pertaining to the  symptoms, circumstances and history that led to her hospitalization.  She was given initial Axis I diagnosis of polysubstance  abuse/dependence, and history of bipolar disorder.   MEDICAL AND LABORATORY:  The patient was medically and physically  assessed by the psychiatric nurse practitioner.  There were no  significant medical issues as she was in good health.   HOSPITAL COURSE:  The patient was admitted to the adult inpatient  psychiatric service.  She presented as a well-nourished, normally-  developed female who was alert, fully oriented and well-organized.  Her  mood was depressed, with sad and tearful affect.  Regarding her drug  abuse.  She stated I am sick of it.  She indicated that she wanted to  get clean and sober for the sake of herself and her young children.   She was involved in milieu therapy, and therapeutic groups and  activities geared towards individuals with both mood disorders and  substance abuse problems.  She was a good participant in the treatment  program.  She  was placed on both Librium and clonidine withdrawal  protocols to address her detoxification symptoms, which turned out to be  minimal on these regimens.   On the third hospital day there was a family counseling session  involving the patient and her mother.  They discussed her desire to  remain drug and alcohol free.  Mother indicated that she is supportive  of her daughter but that the patient would have to earn her trust again.  They discussed her drug abuse history going back to the age of 27.  The  patient's mother encouraged the patient to focus on her children and to  terminate friendships with friends that use or sell drugs.  They  discussed wanting to have a better relationship with one another and to  be positive supports for one another.  They discussed her attending AA  meetings and her plan to get an AA sponsor.  They discussed her plan to  return back home with her mother and her children, and follow-up care.  The counselor gave the patient a pamphlet on suicide prevention and the  crisis hotline.   The patient was discharged on the following day, as her detoxification  course had appeared to be completed.   AFTERCARE:  The patient was to follow-up at [facility]  with an appointment on 05/12/2006.   DISCHARGE MEDICATIONS:  There were no psychotropic medications upon  discharge.   The patient was encouraged to attend NA meetings and to get a sponsor.   DISCHARGE DIAGNOSES:  AXIS I: Polysubstance abuse/dependence.  AXIS II: Deferred.  AXIS III: In good health.  AXIS IV: Stressors severe.  AXIS V: Global assessment of functioning on discharge 65.      Anselm Jungling, MD  Electronically Signed     SPB/MEDQ  D:  06/03/2006  T:  06/03/2006  Job:  [id number]

## 2010-11-08 NOTE — Discharge Summary (Signed)
NAME:  [name], [name]                  ACCOUNT NO.:  [id number]   MEDICAL RECORD NO.:  [id number]          PATIENT TYPE:  INP   LOCATION:  A302                          FACILITY:  [facility]   PHYSICIAN:  Vania Rea, M.D. DATE OF BIRTH:  [birth year]   DATE OF ADMISSION:  10/21/2004  DATE OF DISCHARGE:  LH                                 DISCHARGE SUMMARY   PRIMARY CARE PHYSICIAN:  Dr. Montey Hora.   DISCHARGE DIAGNOSES:  1.  Acute left pyelonephritis, much improved.  2.  Depression, stable.  3.  Polysubstance abuse.  4.  Hyperglycemia.   DISPOSITION:  Discharged to home.   CONDITION ON DISCHARGE:  Stable.   DISCHARGE MEDICATIONS:  1.  Levaquin 250 mg daily for 10 days.  2.  Vicodin one to two tablets every four hours when necessary, #30 tablets      prescribed.  3.  Resume home medications including Wellbutrin 75 mg daily, Lexapro 20 mg      daily, Seroquel 25 mg at bedtime when necessary, Ambien 10 mg at      bedtime.   SPECIAL INVESTIGATIONS THIS ADMISSION:  Ultrasound of the kidneys and renal  system was normal.   HOSPITAL COURSE:  Please refer to yesterday's admission history and  physical.  This is a young lady who presented with a one-week history of  frequency, back pain and fevers.  Was found to have elevated temperature in  the emergency room and a white count and a urinalysis suggestive of  infection and a mild leukocytosis.  She was admitted with a diagnosis of  pyelonephritis, started on Quinolone antibiotics and improved dramatically,  although she continued to make requests of pain for Dilaudid every two  hours.  This morning, she is afebrile and her white count has come down to  13,000.  Her renal ultrasound shows no evidence of inflammation of the  kidneys.  Additionally, her urine drug screen was positive for cocaine,  benzodiazepines, opiates and THC.   Her blood sugars have been persistently elevated on all her blood work done;  however, the Cipro which  she has been receiving is mixed in dextrose.  We  plan to do a hemoglobin A1C prior to discharge and to call her with the  results tomorrow.   This morning, the patient is alert and oriented.  VITAL SIGNS:  Temperature 98.2, pulse 83, respirations 20, blood pressure  121/69.  Her fasting blood sugar was 143.  Her weight was recorded as 147  pounds.  CHEST:  Clear to auscultation bilaterally.  CARDIOVASCULAR:  Regular rhythm.  ABDOMEN:  Soft, no masses.   LABORATORY DATA:  Her white count has decreased to 13,000, hemoglobin 13,  platelets 152.  Her sodium is 135, potassium 3.5, chloride 105, CO2 24,  glucose 138, BUN 5, creatinine 0.9.  Her liver function tests are  essentially normal.  Her calcium is 8.3.  Her urine drug screen as noted,  positive benzodiazepines, cocaine, opiates and THC.  Urine culture is still  pending.   FOLLOW UP:  1.  Followup with her primary  care physician Dr. Montey Hora.  2.  Followup with mental health department for her substance abuse.   SPECIAL INSTRUCTIONS:  The patient has been advised to discontinue the use  of cocaine and tobacco, although she says she thinks somebody must have put  it in her marijuana because she does not take cocaine.  She has been advised  to call us tomorrow afternoon for the results of her hemoglobin A1C if we  have not contacted her first.  She has been advised that if her urine  cultures grows organisms resistant to Levaquin, she will be contacted.   ADDENDUM  urine Culture : no growth  HbAIC: 4.7. Advised to follow up with pcp for random/fasting blood sugar.      LC/MEDQ  D:  10/22/2004  T:  10/22/2004  Job:  [id number]

## 2010-11-08 NOTE — Op Note (Signed)
NAME:  [name], [name]                  ACCOUNT NO.:  [id number]   MEDICAL RECORD NO.:  [id number]          PATIENT TYPE:  INP   LOCATION:  5731                         FACILITY:  [facility]   PHYSICIAN:  Antony Contras, MD     DATE OF BIRTH:  06-18-1981   DATE OF PROCEDURE:  12/08/2005  DATE OF DISCHARGE:                                 OPERATIVE REPORT   PREOPERATIVE DIAGNOSIS:  Chin abscess.   POSTOPERATIVE DIAGNOSIS:  Chin abscess.   PROCEDURE:  Incision and drainage of chin abscess.   SURGEON:  Dr. Christia Reading.   ANESTHESIA:  General endotracheal anesthesia.   COMPLICATIONS:  None.   INDICATIONS:  The patient is a 30 year old white female who has developed  swelling of her chin and neck since Monday and has been on Levaquin since  Wednesday.  She was admitted to the hospital yesterday, Sunday, with  findings on CT of an abscess involving the soft tissues of the chin in front  of the mentum of the mandible.  She was placed on intravenous antibiotics  and presents to the operating room today for surgical management.   FINDINGS:  There was a small skin defect in the center of the chin with a  scab where previous drainage had occurred.  The skin around this was rough  and yellow in color.  An incision was made through a scar on the left chin  and the abscess cavity was entered.  No significant pus was drained.  A  Penrose drain was placed.  Cultures were sent from swabs that were taken  from the abscess cavity.   DESCRIPTION OF PROCEDURE:  The patient was identified in the holding room  and order informed consent having been obtained, the patient was moved to  the operating suite and put on the operating table in the supine position.  Anesthesia was induced and the patient was intubated by the anesthesia team  without difficulty.  The patient was already on intravenous antibiotics.  The eyes were taped closed, and the lower face and neck were prepped and  draped in sterile fashion.   A stab incision was then made with a 15-blade  scalpel through the left chin scar.  This was extended with hemostats into  the abscess cavity which was easily entered.  Even with opening of the  passage and pressure on the chin, no significant pus was expressed.  Additional exploration was performed through the small scab opening into the  abscess cavity as well, and no pus was encountered.  Culture swabs were then  placed through the stab incision into the abscess cavity and rubbed within  the abscess cavity and given for culture.  At this point the abscess cavity  was copiously irrigated with saline using a red rubber catheter and bulb  syringe.  Bleeding  was controlled at the skin using Bovie electrocautery.  A quarter-inch  Penrose drain was then placed and secured at the skin using a 2-0 nylon  suture.  A Kerlix fluff dressing was then added.  The patient was then  turned back  to Anesthesia for wake-up, was extubated and moved to the  recovery room in stable condition.      Antony Contras, MD  Electronically Signed     DDB/MEDQ  D:  12/08/2005  T:  12/09/2005  Job:  [id number]

## 2010-11-08 NOTE — Discharge Summary (Signed)
NAME:  [name], [name]                  ACCOUNT NO.:  [id number]   MEDICAL RECORD NO.:  [id number]          PATIENT TYPE:  INP   LOCATION:  9154                          FACILITY:  [facility]   PHYSICIAN:  Magnus Sinning. Rice, M.D. DATE OF BIRTH:  03-Sep-1980   DATE OF ADMISSION:  03/25/2004  DATE OF DISCHARGE:  03/27/2004                                 DISCHARGE SUMMARY   DISCHARGE DIAGNOSES:  1.  Intrauterine pregnancy at 30 weeks 3 days.  2.  Preterm labor.  3.  Group B streptococcus negative.  4.  Fetal fibronectin positive.  5.  History of polysubstance abuse.   DISCHARGE MEDICATIONS:  1.  Terbutaline 2.5 mg p.o. q.4h.  2.  Darvocet-N 100 one p.o. daily p.r.n. pain.  3.  Other medicines as per previously prescribed.   DISCHARGE DISPOSITION:  The patient discharged to home on strict bedrest and  pelvic rest.  She is to follow up for a cervix at [facility] at 1:45 p.m. on Friday, March 29, 2004.  She is to call with any  change in pressure or contractions prior to that time.   HISTORY AND PHYSICAL:  This is a 30 year old G2 P0-1-0-1 at 30 weeks 1 day  by a 6-week ultrasound who was seen at the office for a regular prenatal  visit and mentioned the increased vaginal pressure and contractions that  have been brief and intermittent and had eased off by the time she arrived  at the office.  She also mentioned some mucous discharge but no rupture of  membranes or vaginal bleeding.  She had slightly decreased fetal movement.  Past medical history:  Bipolar disorder, respiratory allergies, insomnia.  Current medications:  Ambien 10 mg at bedtime, Darvocet-N 100 one per day,  prenatal vitamins one per day, and Zantac 150 mg b.i.d.  Allergies:  None.  Social history:  Plans breastfeeding, [facility] for pediatric care.  She is a smoker, two cigarettes per day, and a history of cocaine use.  Recent drug screens have been negative.  Past OB history:  April 2003 -   a  female child, 5 pounds 3 ounces, at 35 weeks.  That pregnancy complicated by  preterm labor.  Physical exam on admission:  Blood pressure 109/68,  temperature 98.9.  Exam normal.  Fundal height noted to be 28 cm.  Fetal  heart tones 125-135 with accelerations present, occasional mild variables.  Tocometry did not reveal many contractions.  SVE:  Loose 1 cm dilated, 80%  effaced, -1 station.  Bag was definitely felt to be intact, and vertex  presentation.   The patient was therefore admitted for observation and evaluation of preterm  labor in a patient with a history of preterm delivery.  The patient was  given betamethasone in the usual dose.  She was continued on continuous  monitoring.  She was started on Unasyn 3 g IV q.6h. and she received an OB  ultrasound to check the baby's growth.  The ultrasound showed the baby was  at the 50th to 75th percentile for 30 weeks.  She had a normal AFI at 11.6  and her cervix length was 1.6 cm.  The patient was noted to have  contractions overnight, which resolved quickly after a subcutaneous  terbutaline shot.  Those contractions reoccurred later in the day.  She  again received a subcutaneous terbutaline injection.  She was started on  terbutaline at that point and remained contraction free for the next 24  hours.  A fetal fibronectin done on March 27, 2004 was returned positive.  Her vaginal exam remained essentially  unchanged and she was therefore discharged home on strict bedrest, with the  remainder of the discharge instructions as listed above.  The care of this  patient was discussed with Dr. Okey Dupre, who agreed with the care and reviewed  the chart.     Kath   KMR/MEDQ  D:  03/27/2004  T:  03/27/2004  Job:  [id number]

## 2010-11-11 ENCOUNTER — Emergency Department (HOSPITAL_COMMUNITY): Payer: Self-pay

## 2010-11-11 ENCOUNTER — Emergency Department (HOSPITAL_COMMUNITY)
Admission: EM | Admit: 2010-11-11 | Discharge: 2010-11-12 | Disposition: A | Discharge status: Home / Self Care | Payer: Self-pay | Attending: Emergency Medicine | Admitting: Emergency Medicine

## 2010-11-11 DIAGNOSIS — R51 Headache: Secondary | ICD-10-CM | POA: Insufficient documentation

## 2010-11-11 DIAGNOSIS — S0083XA Contusion of other part of head, initial encounter: Secondary | ICD-10-CM | POA: Insufficient documentation

## 2010-11-11 DIAGNOSIS — S0990XA Unspecified injury of head, initial encounter: Secondary | ICD-10-CM | POA: Insufficient documentation

## 2010-11-11 DIAGNOSIS — IMO0002 Reserved for concepts with insufficient information to code with codable children: Secondary | ICD-10-CM | POA: Insufficient documentation

## 2010-11-11 DIAGNOSIS — S0003XA Contusion of scalp, initial encounter: Secondary | ICD-10-CM | POA: Insufficient documentation

## 2010-11-11 DIAGNOSIS — Y92009 Unspecified place in unspecified non-institutional (private) residence as the place of occurrence of the external cause: Secondary | ICD-10-CM | POA: Insufficient documentation

## 2010-11-11 DIAGNOSIS — Y998 Other external cause status: Secondary | ICD-10-CM | POA: Insufficient documentation

## 2010-11-12 ENCOUNTER — Emergency Department (HOSPITAL_COMMUNITY): Payer: Self-pay

## 2011-03-26 LAB — CBC
HCT: 42.3
Hemoglobin: 14.5
MCHC: 34.3
MCV: 101.1 — ABNORMAL HIGH
MCV: 101.2 — ABNORMAL HIGH
Platelets: 169
RBC: 4.18
RDW: 13.4
WBC: 11.3 — ABNORMAL HIGH
WBC: 7.3

## 2011-03-26 LAB — CULTURE, ROUTINE-ABSCESS

## 2011-03-26 LAB — DIFFERENTIAL
Basophils Relative: 0
Eosinophils Absolute: 0.2
Eosinophils Relative: 1
Lymphs Abs: 2.7
Monocytes Absolute: 0.8
Monocytes Relative: 7

## 2011-03-26 LAB — MRSA CULTURE

## 2011-03-26 LAB — BASIC METABOLIC PANEL
BUN: 6
Chloride: 106
Creatinine, Ser: 0.8
GFR calc non Af Amer: >60
Glucose, Bld: 81

## 2011-03-26 LAB — CULTURE, BLOOD (ROUTINE X 2)
Culture: NO GROWTH
Culture: NO GROWTH

## 2011-03-26 LAB — RAPID URINE DRUG SCREEN, HOSP PERFORMED
Barbiturates: NOT DETECTED
Opiates: POSITIVE — AB

## 2011-03-26 LAB — POCT I-STAT, CHEM 8
HCT: 45
Hemoglobin: 15.3 — ABNORMAL HIGH
Sodium: 142
TCO2: 34

## 2011-04-02 LAB — I-STAT 8, (EC8 V) (CONVERTED LAB)
Acid-base deficit: 2
BUN: 10
Bicarbonate: 22.4
Chloride: 105
Glucose, Bld: 96
HCT: 44
Hemoglobin: 15
Operator id: 294511
Potassium: 3.4 — ABNORMAL LOW
Sodium: 138
TCO2: 24
pCO2, Ven: 38.4 — ABNORMAL LOW
pH, Ven: 7.374 — ABNORMAL HIGH

## 2011-04-02 LAB — POCT I-STAT CREATININE: Operator id: 294511

## 2011-04-03 LAB — DIFFERENTIAL
Basophils Relative: 1
Lymphocytes Relative: 31
Monocytes Absolute: 0.5
Monocytes Relative: 7
Neutro Abs: 4.4
Neutrophils Relative %: 59

## 2011-04-03 LAB — WOUND CULTURE: Culture: NO GROWTH

## 2011-04-03 LAB — CBC
Hemoglobin: 12.7
RBC: 3.84 — ABNORMAL LOW
WBC: 7.5

## 2011-04-03 LAB — BASIC METABOLIC PANEL
Calcium: 9.2
Creatinine, Ser: 0.66
GFR calc Af Amer: >60
GFR calc non Af Amer: >60
Sodium: 141

## 2011-04-04 LAB — DIFFERENTIAL
Basophils Absolute: 0.1
Basophils Relative: 1
Eosinophils Absolute: 0.1
Eosinophils Relative: 1
Monocytes Absolute: 0.6

## 2011-04-04 LAB — URINALYSIS, ROUTINE W REFLEX MICROSCOPIC
Bilirubin Urine: NEGATIVE
Hgb urine dipstick: NEGATIVE
Nitrite: NEGATIVE
Specific Gravity, Urine: 1.025
Urobilinogen, UA: 1
pH: 6

## 2011-04-04 LAB — COMPREHENSIVE METABOLIC PANEL
AST: 64 — ABNORMAL HIGH
Albumin: 4.1
Alkaline Phosphatase: 65
CO2: 22
Chloride: 111
GFR calc Af Amer: >60
GFR calc non Af Amer: >60
Potassium: 3.8
Total Bilirubin: 0.9

## 2011-04-04 LAB — CBC
HCT: 38.3
MCV: 94.4
Platelets: 179
RBC: 4.05
WBC: 9.1

## 2011-04-04 LAB — LIPASE, BLOOD: Lipase: 24

## 2011-04-04 LAB — PREGNANCY, URINE: Preg Test, Ur: NEGATIVE

## 2011-05-19 ENCOUNTER — Emergency Department (HOSPITAL_COMMUNITY)
Admission: EM | Admit: 2011-05-19 | Discharge: 2011-05-19 | Disposition: A | Discharge status: Home / Self Care | Payer: Self-pay | Attending: Emergency Medicine | Admitting: Emergency Medicine

## 2011-05-19 ENCOUNTER — Encounter: Payer: Self-pay | Admitting: *Deleted

## 2011-05-19 DIAGNOSIS — R404 Transient alteration of awareness: Secondary | ICD-10-CM | POA: Insufficient documentation

## 2011-05-19 DIAGNOSIS — F419 Anxiety disorder, unspecified: Secondary | ICD-10-CM

## 2011-05-19 DIAGNOSIS — R569 Unspecified convulsions: Secondary | ICD-10-CM | POA: Insufficient documentation

## 2011-05-19 DIAGNOSIS — F411 Generalized anxiety disorder: Secondary | ICD-10-CM | POA: Insufficient documentation

## 2011-05-19 HISTORY — DX: Unspecified convulsions: R56.9

## 2011-05-19 HISTORY — DX: Anxiety disorder, unspecified: F41.9

## 2011-05-19 HISTORY — DX: Bipolar disorder, unspecified: F31.9

## 2011-05-19 MED ORDER — LORAZEPAM 1 MG PO TABS: 1.0000 mg | ORAL_TABLET | Freq: Three times a day (TID) | ORAL | Status: AC | PRN

## 2011-05-19 NOTE — ED Notes (Signed)
Reports hx of seizures, reports having at least 4 seizures this week. Has been taken meds as prescribed.

## 2011-05-19 NOTE — ED Provider Notes (Signed)
History     CSN: [id number] Arrival date & time: 05/19/2011 10:10 AM   First MD Initiated Contact with Patient 05/19/11 1249      Chief Complaint  Patient presents with  . Seizures     Patient is a 30 y.o. female presenting with seizures. The history is provided by the patient.  Seizures  This is a chronic problem. The current episode started more than 2 days ago. The problem has been rapidly improving. There were 4 to 5 (within past week, last one 4 days ago) seizures. Pertinent negatives include no confusion, no neck stiffness and no vomiting. Characteristics include loss of consciousness. The episode was witnessed. Possible causes do not include med or dosage change or missed seizure meds. There has been no fever.  Pt reports taking VPA as scheduled but has had 4 seizures in past week None today No fever No new headaches No focal weakness She does not have neurologist She is also requesting meds for anxiety  Past Medical History  Diagnosis Date  . Seizures   . Bipolar 1 disorder   . Anxiety     Past Surgical History  Procedure Date  . Tubal ligation     History reviewed. No pertinent family history.  History  Substance Use Topics  . Smoking status: Current Everyday Smoker -- 0.5 packs/day    Types: Cigarettes  . Smokeless tobacco: Not on file  . Alcohol Use: 7.2 oz/week    12 Cans of beer per week    OB History    Grav Para Term Preterm Abortions TAB SAB Ect Mult Living                  Review of Systems  Gastrointestinal: Negative for vomiting.  Neurological: Positive for seizures and loss of consciousness.  Psychiatric/Behavioral: Negative for confusion.  All other systems reviewed and are negative.    Allergies  Review of patient's allergies indicates no known allergies.  Home Medications   Current Outpatient Rx  Name Route Sig Dispense Refill  . DIVALPROEX SODIUM 500 MG PO TB24 Oral Take 1,000 mg by mouth daily.      Marland Kitchen LORAZEPAM 1 MG PO  TABS Oral Take 1 tablet (1 mg total) by mouth 3 (three) times daily as needed for anxiety. 5 tablet 0    BP 114/66  Pulse 76  Temp(Src) 98.2 F (36.8 C) (Oral)  Resp 18  SpO2 98%  Physical Exam  CONSTITUTIONAL: Well developed/well nourished HEAD AND FACE: Normocephalic/atraumatic EYES: EOMI/PERRL ENMT: Mucous membranes moist, no tongue lacerations noted NECK: supple no meningeal signs SPINE:entire spine nontender CV: S1/S2 noted, no murmurs/rubs/gallops noted LUNGS: Lungs are clear to auscultation bilaterally, no apparent distress ABDOMEN: soft, nontender, no rebound or guarding GU:no cva tenderness NEURO: Awake/alert, facies symmetric, no arm or leg drift is noted Cranial nerves 3/4/5/6/12/29/08/11/12 tested and intact Gait normal EXTREMITIES: pulses normal, full ROM SKIN: warm, color normal PSYCH: no abnormalities of mood noted   ED Course  Procedures    1. Seizure   2. Anxiety       MDM  Nursing notes reviewed and considered in documentation   Pt stable in ED No sz today meds given for anxiety Pt stable for d/c and outpatient followup She did not want her VPA checked        Joya Gaskins, MD 05/19/11 1409

## 2011-11-18 ENCOUNTER — Ambulatory Visit (HOSPITAL_COMMUNITY)
Admission: RE | Admit: 2011-11-18 | Discharge: 2011-11-18 | Disposition: A | Discharge status: Home / Self Care | Payer: Self-pay | Source: Home / Self Care | Attending: Psychiatry | Admitting: Psychiatry

## 2011-11-18 ENCOUNTER — Encounter (HOSPITAL_COMMUNITY): Payer: Self-pay | Admitting: *Deleted

## 2011-11-18 ENCOUNTER — Emergency Department (HOSPITAL_COMMUNITY)
Admission: EM | Admit: 2011-11-18 | Discharge: 2011-11-19 | Disposition: A | Discharge status: Home / Self Care | Payer: Self-pay | Attending: Emergency Medicine | Admitting: Emergency Medicine

## 2011-11-18 DIAGNOSIS — Z79899 Other long term (current) drug therapy: Secondary | ICD-10-CM | POA: Insufficient documentation

## 2011-11-18 DIAGNOSIS — G40909 Epilepsy, unspecified, not intractable, without status epilepticus: Secondary | ICD-10-CM | POA: Insufficient documentation

## 2011-11-18 DIAGNOSIS — F1910 Other psychoactive substance abuse, uncomplicated: Secondary | ICD-10-CM | POA: Insufficient documentation

## 2011-11-18 DIAGNOSIS — F319 Bipolar disorder, unspecified: Secondary | ICD-10-CM | POA: Insufficient documentation

## 2011-11-18 DIAGNOSIS — F1920 Other psychoactive substance dependence, uncomplicated: Secondary | ICD-10-CM

## 2011-11-18 DIAGNOSIS — F1010 Alcohol abuse, uncomplicated: Secondary | ICD-10-CM | POA: Insufficient documentation

## 2011-11-18 LAB — CBC
MCH: 33.4 pg (ref 26.0–34.0)
MCV: 97.2 fL (ref 78.0–100.0)
Platelets: 205 10*3/uL (ref 150–400)
RDW: 13.8 % (ref 11.5–15.5)
WBC: 8 10*3/uL (ref 4.0–10.5)

## 2011-11-18 LAB — COMPREHENSIVE METABOLIC PANEL
AST: 41 U/L — ABNORMAL HIGH (ref 0–37)
Albumin: 3.7 g/dL (ref 3.5–5.2)
Calcium: 8.9 mg/dL (ref 8.4–10.5)
Creatinine, Ser: 0.63 mg/dL (ref 0.50–1.10)
Sodium: 143 mEq/L (ref 135–145)

## 2011-11-18 LAB — RAPID URINE DRUG SCREEN, HOSP PERFORMED
Amphetamines: NOT DETECTED
Benzodiazepines: NOT DETECTED
Cocaine: POSITIVE — AB
Opiates: NOT DETECTED
Tetrahydrocannabinol: POSITIVE — AB

## 2011-11-18 MED ORDER — ONDANSETRON HCL 4 MG PO TABS
4.0000 mg | ORAL_TABLET | Freq: Three times a day (TID) | ORAL | Status: DC | PRN
  Administered 2011-11-18: 4 mg via ORAL
  Filled 2011-11-18: qty 1

## 2011-11-18 MED ORDER — LORAZEPAM 1 MG PO TABS
0.0000 mg | ORAL_TABLET | Freq: Four times a day (QID) | ORAL | Status: DC
  Administered 2011-11-18: 1 mg via ORAL
  Filled 2011-11-18: qty 1

## 2011-11-18 MED ORDER — IBUPROFEN 600 MG PO TABS
600.0000 mg | ORAL_TABLET | Freq: Three times a day (TID) | ORAL | Status: DC | PRN
  Administered 2011-11-19: 600 mg via ORAL
  Filled 2011-11-18: qty 3

## 2011-11-18 MED ORDER — NICOTINE 21 MG/24HR TD PT24
21.0000 mg | MEDICATED_PATCH | Freq: Every day | TRANSDERMAL | Status: DC
  Administered 2011-11-18 – 2011-11-19 (×2): 21 mg via TRANSDERMAL
  Filled 2011-11-18 (×2): qty 1

## 2011-11-18 MED ORDER — LORAZEPAM 1 MG PO TABS
1.0000 mg | ORAL_TABLET | Freq: Three times a day (TID) | ORAL | Status: DC | PRN
  Administered 2011-11-19 (×2): 1 mg via ORAL
  Filled 2011-11-18 (×2): qty 1

## 2011-11-18 MED ORDER — LORAZEPAM 1 MG PO TABS: 0.0000 mg | ORAL_TABLET | Freq: Two times a day (BID) | ORAL | Status: DC

## 2011-11-18 MED ORDER — ZOLPIDEM TARTRATE 5 MG PO TABS: 5.0000 mg | ORAL_TABLET | Freq: Every evening | ORAL | Status: DC | PRN

## 2011-11-18 NOTE — ED Notes (Signed)
Pt resting quietly. Pt provided crackers and beverage.

## 2011-11-18 NOTE — BH Assessment (Signed)
Assessment Note   [name] is an 31 y.o. female. PT PRESENTS WITH INCREASE DEPRESSION & A LONG HX OF ABUSE. PT STATES THAT SHE BEEN ABUSING DRUGS INCLUDING PAIN PILL; ETOH; COCAINE/CRACK & CRYSTAL METH WHICH SHE WOULD LIKE TO GET CLEAN FROM. PT SAYS SHE WAS SUPPOSE TO BE ON MEDS BUT DOES NOT BELIEVE ITS WORKING. PT SAYS SHE CANT CONTINUE LIVING THIS WAY & SAYS HE HEARS DEAD PEOPLE. PT IS EMOTIONAL & TEARFUL; BELIEVES SOMETHING IS WRONG WITH HER. PT WAS RAN BY DR. Allena Katz WHO WANTED PT TO BE MEDICALLY CLEARED & WILL BE REVIEWED AT THAT TIME. [place] CHARGE NURSE (DIANE) & COUNSELOR WERE NOTIFIED.  Axis I: Bipolar, mixed, Substance Induced Mood Disorder and POLYSUSBTANCE DEPENDENCE Axis II: Deferred Axis III:  Past Medical History  Diagnosis Date  . Seizures   . Bipolar 1 disorder   . Anxiety    Axis IV: economic problems, other psychosocial or environmental problems, problems related to social environment and problems with primary support group Axis V: 31-40 impairment in reality testing  Past Medical History:  Past Medical History  Diagnosis Date  . Seizures   . Bipolar 1 disorder   . Anxiety     Past Surgical History  Procedure Date  . Tubal ligation     Family History: No family history on file.  Social History:  reports that she has been smoking Cigarettes.  She has been smoking about .5 packs per day. She does not have any smokeless tobacco history on file. She reports that she drinks about 7.2 ounces of alcohol per week. She reports that she does not use illicit drugs.  Additional Social History:     CIWA:   COWS:    Allergies: No Known Allergies  Home Medications:  (Not in a hospital admission)  OB/GYN Status:  No LMP recorded.  General Assessment Data Location of Assessment: [facility] [address] Assessment Services Living Arrangements: Non-relatives/Friends Can pt return to current living arrangement?: Yes Admission Status: Voluntary Is patient capable of signing  voluntary admission?: Yes Transfer from: Acute Hospital Referral Source: Self/Family/Friend     Risk to self Suicidal Ideation: No Suicidal Intent: No Is patient at risk for suicide?: No Suicidal Plan?: No Access to Means: No What has been your use of drugs/alcohol within the last 12 months?: PT ADMITS ABUSING PAIN PILL(OXYCODONE); CRACK COCAINE; ETOH; CRYSTAL METH WHICH SHE LAST USE 11/17/11 &  HAS BEEN USING DAILY. Previous Attempts/Gestures: No How many times?: 0  Other Self Harm Risks: NA Triggers for Past Attempts: Other personal contacts;Unpredictable Intentional Self Injurious Behavior: None Family Suicide History: Unknown Recent stressful life event(s): Turmoil (Comment);Financial Problems Persecutory voices/beliefs?: Yes Depression: Yes Depression Symptoms: Insomnia;Loss of interest in usual pleasures;Isolating Substance abuse history and/or treatment for substance abuse?: Yes Suicide prevention information given to non-admitted patients: Not applicable  Risk to Others Homicidal Ideation: No Thoughts of Harm to Others: No Current Homicidal Intent: No Current Homicidal Plan: No Access to Homicidal Means: No Identified Victim: NA History of harm to others?: No Assessment of Violence: None Noted Violent Behavior Description: CALM, COOPERATIVE, IMPAIRED, EMOTIONAL & TEARFUL Does patient have access to weapons?: No Criminal Charges Pending?: No Does patient have a court date: No  Psychosis Hallucinations: None noted Delusions: None noted  Mental Status Report Appear/Hygiene: Disheveled;Poor hygiene;Body odor Eye Contact: Fair Motor Activity: Freedom of movement;Restlessness Speech: Logical/coherent;Rapid Level of Consciousness: Alert Mood: Depressed;Anxious;Anhedonia;Euphoric;Helpless;Sad Affect: Depressed;Sad;Appropriate to circumstance;Euphoric;Anxious Anxiety Level: Minimal Thought Processes: Coherent;Relevant Judgement: Impaired Orientation:  Person;Place;Time;Situation Obsessive  Compulsive Thoughts/Behaviors: None  Cognitive Functioning Concentration: Decreased Memory: Recent Intact;Remote Intact IQ: Average Insight: Poor Impulse Control: Poor Appetite: Poor Weight Loss: 0  Weight Gain: 0  Sleep: Decreased Total Hours of Sleep: 3  Vegetative Symptoms: None  ADLScreening [facility] Assessment Services) Patient's cognitive ability adequate to safely complete daily activities?: Yes Patient able to express need for assistance with ADLs?: Yes Independently performs ADLs?: Yes  Abuse/Neglect [facility]) Physical Abuse: Denies Verbal Abuse: Denies Sexual Abuse: Denies  Prior Inpatient Therapy Prior Inpatient Therapy: Yes Prior Therapy Dates: UNK Prior Therapy Facilty/Provider(s): [facility](7X +); [facility] (1X); [facility] (1X) Reason for Treatment: STABILIZATION  Prior Outpatient Therapy Prior Outpatient Therapy: No Prior Therapy Dates: NA Prior Therapy Facilty/Provider(s): NA Reason for Treatment: NA  ADL Screening (condition at time of admission) Patient's cognitive ability adequate to safely complete daily activities?: Yes Patient able to express need for assistance with ADLs?: Yes Independently performs ADLs?: Yes       Abuse/Neglect Assessment (Assessment to be complete while patient is alone) Physical Abuse: Denies Verbal Abuse: Denies Sexual Abuse: Denies          Additional Information 1:1 In Past 12 Months?: No CIRT Risk: No Elopement Risk: No Does patient have medical clearance?: Yes     Disposition:  Disposition Disposition of Patient: Inpatient treatment program;Referred to [facility] FOR MEDICAL CLEARANCE) Type of inpatient treatment program: Adult  On Site Evaluation by:   Reviewed with Physician:     Waldron Session 11/18/2011 3:28 PM

## 2011-11-18 NOTE — ED Notes (Signed)
Patient is resting comfortably. 

## 2011-11-18 NOTE — ED Notes (Signed)
Pt presents to ED for medical clearance, requesting detox from etoh, pain pills, crack cocaine. Denies SI,HI, but feels "hopeless." C/o "spot" on back of head x41yrs, but has been "acting up" recently and would like it checked. Also wants to be checked for "stds and aids." Denies vaginal d/c, bleeding, pain, complaints. "just wanted to be checked since I was already here." Pt last drank 1beer today. Normally drinks at least a 24 pk a day. Last used 3 5/500 hydrocodone yesterday. Last use of cocaine yesterday.

## 2011-11-18 NOTE — ED Notes (Signed)
Pt resting quietly.  Sleeping.

## 2011-11-18 NOTE — ED Provider Notes (Signed)
History     CSN: [id number]  Arrival date & time 11/18/11  1517   First MD Initiated Contact with Patient 11/18/11 1718      Chief Complaint  Patient presents with  . Medical Clearance    (Consider location/radiation/quality/duration/timing/severity/associated sxs/prior treatment) The history is provided by the patient.   Patient here requesting detox from alcohol. Last check was today. Normally drinks a 24 pack of beer a day. Also admits to cocaine and Vicodin use. Denies any suicidal homicidal ideations. No abdominal pain, vomiting, black or tarry stools. Nothing makes her symptoms better or worse. Past Medical History  Diagnosis Date  . Seizures   . Bipolar 1 disorder   . Anxiety     Past Surgical History  Procedure Date  . Tubal ligation     No family history on file.  History  Substance Use Topics  . Smoking status: Current Everyday Smoker -- 0.5 packs/day    Types: Cigarettes  . Smokeless tobacco: Not on file  . Alcohol Use: 14.4 oz/week    24 Cans of beer per week    OB History    Grav Para Term Preterm Abortions TAB SAB Ect Mult Living                  Review of Systems  All other systems reviewed and are negative.    Allergies  Review of patient's allergies indicates no known allergies.  Home Medications   Current Outpatient Rx  Name Route Sig Dispense Refill  . BUPRENORPHINE HCL-NALOXONE HCL 8-2 MG SL SUBL Sublingual Place 1 tablet under the tongue every 12 (twelve) hours.    Marland Kitchen CITALOPRAM HYDROBROMIDE 40 MG PO TABS Oral Take 40 mg by mouth daily.    Marland Kitchen CLONAZEPAM 0.5 MG PO TABS Oral Take 0.5 mg by mouth 3 (three) times daily.    Marland Kitchen DIVALPROEX SODIUM ER 500 MG PO TB24 Oral Take 1,500 mg by mouth daily. Pt takes 1 tablet in the morning and 2 at night    . NORTRIPTYLINE HCL 25 MG PO CAPS Oral Take 25 mg by mouth at bedtime.    Marland Kitchen QUETIAPINE FUMARATE 200 MG PO TABS Oral Take 200 mg by mouth at bedtime.      BP 112/70  Pulse 105  Temp(Src) 98 F  (36.7 C) (Oral)  Resp 16  SpO2 98%  LMP 11/09/2011  Physical Exam  Nursing note and vitals reviewed. Constitutional: She is oriented to person, place, and time. She appears well-developed and well-nourished.  Non-toxic appearance. No distress.  HENT:  Head: Normocephalic and atraumatic.  Eyes: Conjunctivae, EOM and lids are normal. Pupils are equal, round, and reactive to light.  Neck: Normal range of motion. Neck supple. No tracheal deviation present. No mass present.  Cardiovascular: Normal rate, regular rhythm and normal heart sounds.  Exam reveals no gallop.   No murmur heard. Pulmonary/Chest: Effort normal and breath sounds normal. No stridor. No respiratory distress. She has no decreased breath sounds. She has no wheezes. She has no rhonchi. She has no rales.  Abdominal: Soft. Normal appearance and bowel sounds are normal. She exhibits no distension. There is no tenderness. There is no rebound and no CVA tenderness.  Musculoskeletal: Normal range of motion. She exhibits no edema and no tenderness.  Neurological: She is alert and oriented to person, place, and time. She has normal strength. No cranial nerve deficit or sensory deficit. GCS eye subscore is 4. GCS verbal subscore is 5. GCS motor subscore is  6.  Skin: Skin is warm and dry. No abrasion and no rash noted.  Psychiatric: Her speech is normal and behavior is normal. Her affect is blunt.    ED Course  Procedures (including critical care time)  Labs Reviewed  URINE RAPID DRUG SCREEN (HOSP PERFORMED) - Abnormal; Notable for the following:    Cocaine POSITIVE (*)    Tetrahydrocannabinol POSITIVE (*)    All other components within normal limits  CBC  COMPREHENSIVE METABOLIC PANEL  ETHANOL  ACETAMINOPHEN LEVEL   No results found.   No diagnosis found.    MDM  Pt to be seen by act        Toy Baker, MD 11/19/11 [phone]

## 2011-11-19 DIAGNOSIS — F319 Bipolar disorder, unspecified: Secondary | ICD-10-CM

## 2011-11-19 DIAGNOSIS — F1920 Other psychoactive substance dependence, uncomplicated: Secondary | ICD-10-CM

## 2011-11-19 MED ORDER — CITALOPRAM HYDROBROMIDE 40 MG PO TABS
40.0000 mg | ORAL_TABLET | Freq: Every day | ORAL | Status: DC
  Administered 2011-11-19: 40 mg via ORAL
  Filled 2011-11-19: qty 1

## 2011-11-19 MED ORDER — QUETIAPINE FUMARATE 100 MG PO TABS: 200.0000 mg | ORAL_TABLET | Freq: Every day | ORAL | Status: DC

## 2011-11-19 MED ORDER — CLONAZEPAM 0.5 MG PO TABS
0.5000 mg | ORAL_TABLET | Freq: Three times a day (TID) | ORAL | Status: DC
  Administered 2011-11-19: 0.5 mg via ORAL
  Filled 2011-11-19: qty 1

## 2011-11-19 MED ORDER — DIVALPROEX SODIUM ER 500 MG PO TB24
500.0000 mg | ORAL_TABLET | Freq: Every day | ORAL | Status: DC
  Administered 2011-11-19: 500 mg via ORAL
  Filled 2011-11-19: qty 1

## 2011-11-19 MED ORDER — DIVALPROEX SODIUM ER 500 MG PO TB24
1000.0000 mg | ORAL_TABLET | Freq: Every day | ORAL | Status: DC
  Filled 2011-11-19: qty 2

## 2011-11-19 MED ORDER — NORTRIPTYLINE HCL 25 MG PO CAPS
25.0000 mg | ORAL_CAPSULE | Freq: Every day | ORAL | Status: DC
  Filled 2011-11-19: qty 1

## 2011-11-19 NOTE — ED Notes (Signed)
Pt belongings, 3 bags, moved to locker #43

## 2011-11-19 NOTE — ED Notes (Signed)
Pt states she wants help with detox from alcohol and drugs.  Last had alcohol yesterday.  No SI/HI.

## 2011-11-19 NOTE — Discharge Instructions (Signed)
Finding Treatment for Alcohol and Drug Addiction   It can be hard to find the right place to get professional treatment. Here are some important things to consider:   There are different types of treatment to choose from.   Some programs are live-in (residential) while others are not (outpatient). Sometimes a combination is offered.   No single type of program is right for everyone.   Most treatment programs involve a combination of education, counseling, and a 12-step, spiritually-based approach.   There are non-spiritually based programs (not 12-step).   Some treatment programs are government sponsored. They are geared for patients without private insurance.   Treatment programs can vary in many respects such as:   Cost and types of insurance accepted.   Types of on-site medical services offered.   Length of stay, setting, and size.   Overall philosophy of treatment.   A person may need specialized treatment or have needs not addressed by all programs. For example, adolescents need treatment appropriate for their age. Other people have secondary disorders that must be managed as well. Secondary conditions can include mental illness, such as depression or diabetes. Often, a period of detoxification from alcohol or drugs is needed. This requires medical supervision and not all programs offer this.   THINGS TO CONSIDER WHEN SELECTING A TREATMENT PROGRAM   Is the program certified by the appropriate government agency? Even private programs must be certified and employ certified professionals.   Does the program accept your insurance? If not, can a payment plan be set up?   Is the facility clean, organized, and well run? Do they allow you to speak with graduates who can share their treatment experience with you? Can you tour the facility? Can you meet with staff?   Does the program meet the full range of individual needs?   Does the treatment program address sexual orientation and physical disabilities? Do they provide  age, gender, and culturally appropriate treatment services?   Is treatment available in languages other than English?   Is long-term aftercare support or guidance encouraged and provided?   Is assessment of an individual's treatment plan ongoing to ensure it meets changing needs?   Does the program use strategies to encourage reluctant patients to remain in treatment long enough to increase the likelihood of success?   Does the program offer counseling (individual or group) and other behavioral therapies?   Does the program offer medicine as part of the treatment regimen, if needed?   Is there ongoing monitoring of possible relapse? Is there a defined relapse prevention program? Are services or referrals offered to family members to ensure they understand addiction and the recovery process? This would help them support the recovering individual.   Are 12-step meetings held at the center or is transport available for patients to attend outside meetings?  In countries outside of the [place], see local directories for contact information for services in your area.   Document Released: 05/08/2005 Document Revised: 05/29/2011 Document Reviewed: 11/18/2007   [employer]® Patient Information ©2012 [employer].

## 2011-11-19 NOTE — Progress Notes (Signed)
Invited pt to [facility] group in psych ED, pt declined, stating that she was about to be discharged. Pt then presented to group room but no other members came. Interacted w/ pt for about 20 min. Pt stated she was here for detox from ETOH, had been to [facility] 3x before. Pt stated she had no plan to prevent relapse, stated that biggest triggers to relapse would be family conflict (re: mother, stepdad) and friends who also drink. Pt stated "I'm tired of the alcohol" and stated she wanted things to be different. Pt stated she saw psychiatrist at [facility] mental health outpatient 1x/46mo, stated she would like to see a counselor there and would req these services - biggest hindrance to that would be finding a ride. Pt stated that she had tried AA before and it didn't work for her but was open to trying again. Pt became briefly tearful when talking about family conflict, stated she wished she could do counseling w/ her family.  Dalary Hollar B MS, LPCA, NCC

## 2011-11-19 NOTE — [facility] Counselor (Signed)
Faxed info to [facility] for IPT review.

## 2011-11-19 NOTE — Consult Note (Signed)
Reason for Consult: Detox for alcohol and also has a polysubstance abuse versus dependence Referring Physician: Dr. Kristin Bruins is an 31 y.o. female.  HPI: Patient was seen and chart reviewed. Patient was brought in by her mother for alcohol detox treatment. Review of her urine drug screen was positive for the cocaine and the tetrahydrocannabinol. She also positive for the alcohol at 33 on arrival to the [facility] long emergency department. Patient had been receiving treatment for psychiatric conditions at  [facility] recovery services, [place]. Patient haschanged her mind, she does not want treatment for alcoholism and she was aware of availability of [facility] from her previous multiple admissions. She stated she is missing her family home, mom and 2 younger daughter's who are 51 and 49 years old. She stated her mom was too sick to take care of the 2 young children. She also stated that she wants to leave her environment which is provoking to drink and do drugs and go to the uncle's home in [place] where she does not have an access for alcohol and drugs. She has plans of going to the church regularly. Patient has denied symptoms of depression, anxiety and psychosis. Patient has no symptoms of for alcohol withdrawal during this evaluation. Patient denied suicidal ideation, homicidal ideation, intentions and plan  Past Medical History  Diagnosis Date  . Seizures   . Bipolar 1 disorder   . Anxiety     Past Surgical History  Procedure Date  . Tubal ligation     No family history on file.  Social History:  reports that she has been smoking Cigarettes.  She has been smoking about .5 packs per day. She does not have any smokeless tobacco history on file. She reports that she drinks about 14.4 ounces of alcohol per week. She reports that she uses illicit drugs (Cocaine).  Allergies: No Known Allergies  Medications: I have reviewed the patient's current  medications.  Results for orders placed during the hospital encounter of 11/18/11 (from the past 48 hour(s))  URINE RAPID DRUG SCREEN (HOSP PERFORMED)     Status: Abnormal   Collection Time   11/18/11  3:36 PM      Component Value Range Comment   Opiates NONE DETECTED  NONE DETECTED     Cocaine POSITIVE (*) NONE DETECTED     Benzodiazepines NONE DETECTED  NONE DETECTED     Amphetamines NONE DETECTED  NONE DETECTED     Tetrahydrocannabinol POSITIVE (*) NONE DETECTED     Barbiturates NONE DETECTED  NONE DETECTED    CBC     Status: Normal   Collection Time   11/18/11  3:55 PM      Component Value Range Comment   WBC 8.0  4.0 - 10.5 (K/uL)    RBC 4.25  3.87 - 5.11 (MIL/uL)    Hemoglobin 14.2  12.0 - 15.0 (g/dL)    HCT 40.9  81.1 - 91.4 (%)    MCV 97.2  78.0 - 100.0 (fL)    MCH 33.4  26.0 - 34.0 (pg)    MCHC 34.4  30.0 - 36.0 (g/dL)    RDW 78.2  95.6 - 21.3 (%)    Platelets 205  150 - 400 (K/uL)   COMPREHENSIVE METABOLIC PANEL     Status: Abnormal   Collection Time   11/18/11  3:55 PM      Component Value Range Comment   Sodium 143  135 - 145 (  mEq/L)    Potassium 3.5  3.5 - 5.1 (mEq/L)    Chloride 105  96 - 112 (mEq/L)    CO2 21  19 - 32 (mEq/L)    Glucose, Bld 105 (*) 70 - 99 (mg/dL)    BUN 4 (*) 6 - 23 (mg/dL)    Creatinine, Ser 1.61  0.50 - 1.10 (mg/dL)    Calcium 8.9  8.4 - 10.5 (mg/dL)    Total Protein 7.2  6.0 - 8.3 (g/dL)    Albumin 3.7  3.5 - 5.2 (g/dL)    AST 41 (*) 0 - 37 (U/L)    ALT 74 (*) 0 - 35 (U/L)    Alkaline Phosphatase 44  39 - 117 (U/L)    Total Bilirubin 0.4  0.3 - 1.2 (mg/dL)    GFR calc non Af Amer >90  >90 (mL/min)    GFR calc Af Amer >90  >90 (mL/min)   ETHANOL     Status: Abnormal   Collection Time   11/18/11  3:55 PM      Component Value Range Comment   Alcohol, Ethyl (B) 33 (*) 0 - 11 (mg/dL)   ACETAMINOPHEN LEVEL     Status: Normal   Collection Time   11/18/11  6:23 PM      Component Value Range Comment   Acetaminophen (Tylenol), Serum <15.0   10 - 30 (ug/mL)   VALPROIC ACID LEVEL     Status: Abnormal   Collection Time   11/18/11  6:31 PM      Component Value Range Comment   Valproic Acid Lvl 37.1 (*) 50.0 - 100.0 (ug/mL)     No results found.  No depression, No anxiety, No psychosis and Positive for bipolar and excessive alcohol consumption Blood pressure 125/82, pulse 85, temperature 98.4 F (36.9 C), temperature source Oral, resp. rate 20, last menstrual period 11/09/2011, SpO2 99.00%.   Assessment/Plan: Polysubstance dependence Bipolar disorder not otherwise specified  Recommended patient should be discharged to outpatient psychiatric services at [facility] recovery services in [facility] with Dr. Charlene Brooke and also substance abuse outpatient treatment program.  Darrol Jump R. 11/19/2011, 3:26 PM

## 2011-11-19 NOTE — BH Assessment (Signed)
Assessment Note   [name] is an 31 y.o. female. Pt initially presented to [facility] as a walk-in on 11/18/11. Pt presents with increase depression & a long hx of abuse. Pt states that she been abusing drugs including pain pill; etoh; cocaine/crack & crystal meth which she would like to get clean from. Pt says she was supposed to be on meds but does not believe it is working. Pt says she can't continue living this way & says he hears dead people. Pt is emotional & tearful; believes something is wrong with her. Pt was sent to [facility] for medical clearance.  Pt evaluated by psychiatrist: Patient was seen and chart reviewed. Patient was brought in by her mother for alcohol detox treatment. Review of her urine drug screen was positive for the cocaine and the tetrahydrocannabinol. She also positive for the alcohol at 33 on arrival to the [facility] long emergency department. Patient had been receiving treatment for psychiatric conditions at [facility] recovery services, [place]. Patient haschanged her mind, she does not want treatment for alcoholism and she was aware of availability of detox [facility] from her previous multiple admissions. She stated she is missing her family home, mom and 2 younger daughter's who are 52 and 19 years old. She stated her mom was too sick to take care of the 2 young children. She also stated that she wants to leave her environment which is provoking to drink and do drugs and go to the uncle's home in [place] where she does not have an access for alcohol and drugs. She has plans of going to the church regularly. Patient has denied symptoms of depression, anxiety and psychosis. Patient has no symptoms of for alcohol withdrawal during this evaluation. Patient denied suicidal ideation, homicidal ideation, intentions and plan Recommended patient should be discharged to outpatient psychiatric services at [facility] recovery services in [facility] with Dr.  Charlene Brooke and also substance abuse outpatient treatment program.  Updated EDP who was agreeable with plan. Also provided [place] OPT resources to pt.  Axis I: Bipolar, Depressed and Substance Abuse Axis II: Deferred Axis III:  Past Medical History  Diagnosis Date  . Seizures   . Bipolar 1 disorder   . Anxiety    Axis IV: economic problems, other psychosocial or environmental problems and problems with access to health care services Axis V: 41-50 serious symptoms  Past Medical History:  Past Medical History  Diagnosis Date  . Seizures   . Bipolar 1 disorder   . Anxiety     Past Surgical History  Procedure Date  . Tubal ligation     Family History: No family history on file.  Social History:  reports that she has been smoking Cigarettes.  She has been smoking about .5 packs per day. She does not have any smokeless tobacco history on file. She reports that she drinks about 14.4 ounces of alcohol per week. She reports that she uses illicit drugs (Cocaine).  Additional Social History:  Alcohol / Drug Use Pain Medications: N/A Prescriptions: See PTA List Over the Counter: N/A History of alcohol / drug use?: Yes Longest period of sobriety (when/how long): Unknown Negative Consequences of Use: Personal relationships;Financial  CIWA: CIWA-Ar BP: 125/82 mmHg Pulse Rate: 85  Nausea and Vomiting: no nausea and no vomiting Tactile Disturbances: none Tremor: not visible, but can be felt fingertip to fingertip Auditory Disturbances: not present Paroxysmal Sweats: barely perceptible sweating, palms moist Visual Disturbances: not present Anxiety: mildly anxious  Headache, Fullness in Head: very mild Agitation: somewhat more than normal activity Orientation and Clouding of Sensorium: oriented and can do serial additions CIWA-Ar Total: 5  COWS:    Allergies: No Known Allergies  Home Medications:  (Not in a hospital admission)  OB/GYN Status:  Patient's last menstrual period was  11/09/2011.  General Assessment Data Location of Assessment: [facility] ED Living Arrangements: Non-relatives/Friends Can pt return to current living arrangement?: Yes Admission Status: Voluntary Is patient capable of signing voluntary admission?: Yes Transfer from: Acute Hospital Referral Source: Self/Family/Friend     Risk to self Suicidal Ideation: No Suicidal Intent: No Is patient at risk for suicide?: No Suicidal Plan?: No Access to Means: No What has been your use of drugs/alcohol within the last 12 months?: PT ADMITS ABUSING PAIN PILL(OXYCODONE); CRACK COCAINE; ETOH; CRYSTAL METH WHICH SHE LAST USE 11/17/11 & HAS BEEN USING DAILY. Previous Attempts/Gestures: No How many times?: 0  Other Self Harm Risks: N/A Triggers for Past Attempts: Other personal contacts;Unpredictable Intentional Self Injurious Behavior: None Family Suicide History: Unknown Recent stressful life event(s): Financial Problems;Turmoil (Comment) Substance abuse history and/or treatment for substance abuse?: Yes  Risk to Others Homicidal Ideation: No Thoughts of Harm to Others: No Current Homicidal Intent: No Current Homicidal Plan: No Access to Homicidal Means: No Identified Victim: N/A History of harm to others?: No  Psychosis Hallucinations: None noted Delusions: None noted  Mental Status Report Motor Activity: Freedom of movement     ADLScreening [facility] Assessment Services) Patient's cognitive ability adequate to safely complete daily activities?: Yes Patient able to express need for assistance with ADLs?: Yes Independently performs ADLs?: Yes  Abuse/Neglect [facility]) Physical Abuse: Denies Verbal Abuse: Denies Sexual Abuse: Denies        ADL Screening (condition at time of admission) Patient's cognitive ability adequate to safely complete daily activities?: Yes Patient able to express need for assistance with ADLs?: Yes Independently performs ADLs?: Yes Weakness of Legs: None Weakness of  Arms/Hands: None  Home Assistive Devices/Equipment Home Assistive Devices/Equipment: None    Abuse/Neglect Assessment (Assessment to be complete while patient is alone) Physical Abuse: Denies Verbal Abuse: Denies Sexual Abuse: Denies Exploitation of patient/patient's resources: Denies Self-Neglect: Denies Values / Beliefs Cultural Requests During Hospitalization: None Spiritual Requests During Hospitalization: None   Advance Directives (For Healthcare) Advance Directive: Patient does not have advance directive;Patient would not like information Pre-existing out of facility DNR order (yellow form or pink MOST form): No Nutrition Screen Diet: Regular (Breakfast tray  given)        Disposition:  Disposition Disposition of Patient: Other dispositions (Pt to FU w/ [facility] in [place] & CD OPT) Other disposition(s): To current provider;Other (Comment) ([facility] in [place] & [place] OPT referrals given)  On Site Evaluation by:   Reviewed with Physician:     Romeo Apple 11/19/2011 3:55 PM

## 2011-11-19 NOTE — ED Notes (Signed)
Pt requesting to leave. RN notified.

## 2011-12-27 ENCOUNTER — Encounter (HOSPITAL_COMMUNITY): Payer: Self-pay | Admitting: *Deleted

## 2011-12-27 ENCOUNTER — Emergency Department (HOSPITAL_COMMUNITY)
Admission: EM | Admit: 2011-12-27 | Discharge: 2011-12-28 | Disposition: A | Discharge status: Home / Self Care | Payer: Self-pay | Attending: Emergency Medicine | Admitting: Emergency Medicine

## 2011-12-27 DIAGNOSIS — F1010 Alcohol abuse, uncomplicated: Secondary | ICD-10-CM | POA: Insufficient documentation

## 2011-12-27 DIAGNOSIS — Z046 Encounter for general psychiatric examination, requested by authority: Secondary | ICD-10-CM | POA: Insufficient documentation

## 2011-12-27 DIAGNOSIS — F319 Bipolar disorder, unspecified: Secondary | ICD-10-CM | POA: Insufficient documentation

## 2011-12-27 DIAGNOSIS — F1910 Other psychoactive substance abuse, uncomplicated: Secondary | ICD-10-CM

## 2011-12-27 DIAGNOSIS — F411 Generalized anxiety disorder: Secondary | ICD-10-CM | POA: Insufficient documentation

## 2011-12-27 DIAGNOSIS — F17200 Nicotine dependence, unspecified, uncomplicated: Secondary | ICD-10-CM | POA: Insufficient documentation

## 2011-12-27 LAB — CBC
HCT: 41.5 % (ref 36.0–46.0)
MCH: 34.2 pg — ABNORMAL HIGH (ref 26.0–34.0)
MCHC: 34 g/dL (ref 30.0–36.0)
MCV: 100.7 fL — ABNORMAL HIGH (ref 78.0–100.0)
RDW: 13.8 % (ref 11.5–15.5)

## 2011-12-27 NOTE — ED Notes (Addendum)
Pt to department with [facility] deputy.  Pt does have IVC papers in place.  Per report, pt has been taking prescription medications not prescribed for her. Family has found illegal drugs in pt's possession. Pt leaves home and comes home very impaired.  Per family, and deputy, pt has verbalized intent to harm self.  Pt presently very groggy and speech slurred. Pt  denies intent to harm self to staff.  EDP at bedside.

## 2011-12-27 NOTE — ED Provider Notes (Signed)
History     CSN: [id number]  Arrival date & time 12/27/11  2303   First MD Initiated Contact with Patient 12/27/11 2311      No chief complaint on file.   (Consider location/radiation/quality/duration/timing/severity/associated sxs/prior treatment) HPI  [name] is a 31 y.o. female with a history of polysubstance abuse, bipolar disorder, seizures, anxiety brought in by law enforcement with IVC paperwork, who presents to the Emergency Department complaining of polysubstance abuse and family concern of suicidal intention. Patient is intoxicated and clearly states she is not suicidal or homicidal. She does want help with alcohol abuse. Last hospitalization for detox was 10/2011. She changed her mind while there and was referred to [facility] OP treatment which she chose not to attend. She has two children however does not have custody. Her mother , with whom she lives with the two children has custody. She admits to drinking heavily-averages 20-30 beers a day. Takes prescription medicines only, per patient.  [facility] provides psychiatric medicines  Past Medical History  Diagnosis Date  . Seizures   . Bipolar 1 disorder   . Anxiety     Past Surgical History  Procedure Date  . Tubal ligation     History reviewed. No pertinent family history.  History  Substance Use Topics  . Smoking status: Current Everyday Smoker -- 0.5 packs/day    Types: Cigarettes  . Smokeless tobacco: Not on file  . Alcohol Use: 14.4 oz/week    24 Cans of beer per week    OB History    Grav Para Term Preterm Abortions TAB SAB Ect Mult Living                  Review of Systems  Constitutional: Negative for fever.       10 Systems reviewed and are negative for acute change except as noted in the HPI.  HENT: Negative for congestion.   Eyes: Negative for discharge and redness.  Respiratory: Negative for cough and shortness of breath.   Cardiovascular: Negative for chest pain.  Gastrointestinal:  Negative for vomiting and abdominal pain.  Musculoskeletal: Negative for back pain.  Skin: Negative for rash.  Neurological: Negative for syncope, numbness and headaches.  Psychiatric/Behavioral:       No behavior change.    Allergies  Review of patient's allergies indicates no known allergies.  Home Medications   Current Outpatient Rx  Name Route Sig Dispense Refill  . BUPRENORPHINE HCL-NALOXONE HCL 8-2 MG SL SUBL Sublingual Place 1 tablet under the tongue every 12 (twelve) hours.    Marland Kitchen CITALOPRAM HYDROBROMIDE 40 MG PO TABS Oral Take 40 mg by mouth daily.    Marland Kitchen CLONAZEPAM 0.5 MG PO TABS Oral Take 0.5 mg by mouth 3 (three) times daily.    Marland Kitchen DIVALPROEX SODIUM ER 500 MG PO TB24 Oral Take 1,500 mg by mouth daily. Pt takes 1 tablet in the morning and 2 at night    . NORTRIPTYLINE HCL 25 MG PO CAPS Oral Take 25 mg by mouth at bedtime.    Marland Kitchen QUETIAPINE FUMARATE 200 MG PO TABS Oral Take 200 mg by mouth at bedtime.      BP 127/76  Pulse 80  Temp 97.8 F (36.6 C)  Resp 20  SpO2 94%  Physical Exam  Nursing note and vitals reviewed. Constitutional:       Awake, alert, nontoxic appearance.Intoxicated  HENT:  Head: Normocephalic and atraumatic.  Right Ear: External ear normal.  Left Ear: External ear normal.  Nose: Nose normal.  Mouth/Throat: Oropharynx is clear and moist.  Eyes: Conjunctivae and EOM are normal. Pupils are equal, round, and reactive to light. Right eye exhibits no discharge. Left eye exhibits no discharge.  Neck: Neck supple.  Cardiovascular: Normal rate and normal heart sounds.   Pulmonary/Chest: Effort normal and breath sounds normal. She exhibits no tenderness.  Abdominal: Soft. There is no tenderness. There is no rebound.  Musculoskeletal: Normal range of motion. She exhibits no tenderness.       Baseline ROM, no obvious new focal weakness.  Neurological:       Mental status and motor strength appears baseline for patient and situation.  Skin: Skin is warm and  dry. No rash noted.  Psychiatric: She has a normal mood and affect.    ED Course  Procedures (including critical care time)  Results for orders placed during the hospital encounter of 12/27/11  CBC      Component Value Range   WBC 7.5  4.0 - 10.5 K/uL   RBC 4.12  3.87 - 5.11 MIL/uL   Hemoglobin 14.1  12.0 - 15.0 g/dL   HCT 40.9  81.1 - 91.4 %   MCV 100.7 (*) 78.0 - 100.0 fL   MCH 34.2 (*) 26.0 - 34.0 pg   MCHC 34.0  30.0 - 36.0 g/dL   RDW 78.2  95.6 - 21.3 %   Platelets 169  150 - 400 K/uL  BASIC METABOLIC PANEL      Component Value Range   Sodium 138  135 - 145 mEq/L   Potassium 3.4 (*) 3.5 - 5.1 mEq/L   Chloride 101  96 - 112 mEq/L   CO2 29  19 - 32 mEq/L   Glucose, Bld 105 (*) 70 - 99 mg/dL   BUN 14  6 - 23 mg/dL   Creatinine, Ser 0.86  0.50 - 1.10 mg/dL   Calcium 9.8  8.4 - 57.8 mg/dL   GFR calc non Af Amer 85 (*) >90 mL/min   GFR calc Af Amer >90  >90 mL/min  URINALYSIS, ROUTINE W REFLEX MICROSCOPIC      Component Value Range   Color, Urine YELLOW  YELLOW   APPearance CLEAR  CLEAR   Specific Gravity, Urine >1.030 (*) 1.005 - 1.030   pH 5.5  5.0 - 8.0   Glucose, UA NEGATIVE  NEGATIVE mg/dL   Hgb urine dipstick TRACE (*) NEGATIVE   Bilirubin Urine SMALL (*) NEGATIVE   Ketones, ur TRACE (*) NEGATIVE mg/dL   Protein, ur 30 (*) NEGATIVE mg/dL   Urobilinogen, UA 0.2  0.0 - 1.0 mg/dL   Nitrite NEGATIVE  NEGATIVE   Leukocytes, UA NEGATIVE  NEGATIVE  URINE RAPID DRUG SCREEN (HOSP PERFORMED)      Component Value Range   Opiates POSITIVE (*) NONE DETECTED   Cocaine POSITIVE (*) NONE DETECTED   Benzodiazepines POSITIVE (*) NONE DETECTED   Amphetamines POSITIVE (*) NONE DETECTED   Tetrahydrocannabinol POSITIVE (*) NONE DETECTED   Barbiturates NONE DETECTED  NONE DETECTED  PREGNANCY, URINE      Component Value Range   Preg Test, Ur NEGATIVE  NEGATIVE  ETHANOL      Component Value Range   Alcohol, Ethyl (B) <11  0 - 11 mg/dL  VALPROIC ACID LEVEL      Component Value  Range   Valproic Acid Lvl 15.7 (*) 50.0 - 100.0 ug/mL  URINE MICROSCOPIC-ADD ON      Component Value Range   Squamous Epithelial / LPF  FEW (*) RARE   WBC, UA 0-2  <3 WBC/hpf   RBC / HPF 3-6  <3 RBC/hpf   Bacteria, UA RARE  RARE  2335 Feliccia, ACT here to see/evaluate patient. Will rescind IVC paperwork.  0600 Yolanda from [facility] called re patient. Questioned patient re willingness to go to detox/rehab program. She is not interested. She continues to deny suicidal ideation, homicidal ideation. She is not psychotic.She will not be processed by [facility]. She will be discharged home.   MDM  Patient with a history of polysubstance abuse brought in with IVC paperwork which was rescinded.as she is done in the past she indicates she is interested in detox/rehabilitation and once she has been in the department and processed, she will change her mind. She has in the past been referred to [facility]. She is noncompliant with any outpatient treatment plan.UDS is positive for all substances except barbiturates. I strongly encouraged the patient to consider rehab. She has refused.  Pt stable in ED with no significant deterioration in condition.The patient appears reasonably stabilized for admission considering the current resources, flow, and capabilities available in the ED at this time, and I doubt any other [facility] requiring further screening and/or treatment in the ED prior to admission.  MDM Reviewed: nursing note and vitals Interpretation: labs           Nicoletta Dress. Colon Branch, MD 12/28/11 [phone]

## 2011-12-28 LAB — PREGNANCY, URINE: Preg Test, Ur: NEGATIVE

## 2011-12-28 LAB — URINALYSIS, ROUTINE W REFLEX MICROSCOPIC
Nitrite: NEGATIVE
pH: 5.5 (ref 5.0–8.0)

## 2011-12-28 LAB — BASIC METABOLIC PANEL
BUN: 14 mg/dL (ref 6–23)
Chloride: 101 mEq/L (ref 96–112)
Creatinine, Ser: 0.9 mg/dL (ref 0.50–1.10)
GFR calc Af Amer: >90 mL/min (ref >90)
GFR calc non Af Amer: 85 mL/min — ABNORMAL LOW (ref >90)

## 2011-12-28 LAB — URINE MICROSCOPIC-ADD ON

## 2011-12-28 LAB — RAPID URINE DRUG SCREEN, HOSP PERFORMED
Barbiturates: NOT DETECTED
Benzodiazepines: POSITIVE — AB

## 2011-12-28 MED ORDER — DIVALPROEX SODIUM 250 MG PO DR TAB
1000.0000 mg | DELAYED_RELEASE_TABLET | Freq: Once | ORAL | Status: AC
  Administered 2011-12-28: 1000 mg via ORAL
  Filled 2011-12-28: qty 4

## 2011-12-28 NOTE — BH Assessment (Signed)
Assessment Note   [name] is an 31 y.o. female. Patient was brought into the emergency room by the [employer], with IVC paperwork stating that she was stealing prescription medications and abusing them. Paperwork also stated that she has made statements that she wants to die. Dr. Colon Branch evaluated the patient, who clearly stated she was not suicidal and did not want to harm herself. Patient is quite intoxicated and can barely stand. She states she is drinking and abusing Suboxone. She is drinking and abusing pills daily. She is not able to articulate the amounts of the drugs she is abusing. She does state she uses about 8-10 beers, usually daily. She is not able to state all of the drugs she is abusing currently. She is not able to hold her head up. She denies suicidal ideation. She denies homicidal ideation. No delusions or hallucinations noted. She admits to having a substance abuse problem. Discussed options with her, including a long term program (2 year program, such as [facility] or [facility]), but patient states she doesn't want to be away from her children. Discussed with her the possibility of a detox/rehab program, followed by a halfway house placement. She didn't comment on that suggestion.   Patient states she wants to be referred voluntarily for treatment. Referrals will be made however, her prognosis is poor as she has not follow through with recommendations made to her on earlier interventions. [facility] notes that family has stated she has stolen pills and such; he is completing a report. Advised that if she is arrested, to contact [facility], ask for [facility], so there is a possibility for the patient to be referred to the [facility].   .Axis I: Substance Abuse Axis II: Deferreed Axis II: Tubal Ligation Axis IV: Significant social issues, including family relationship issues; custodial issues involving her children; financial and environment issues.  Axis V: GAF 61  Past Medical  History:  Past Medical History  Diagnosis Date  . Seizures   . Bipolar 1 disorder   . Anxiety     Past Surgical History  Procedure Date  . Tubal ligation     Family History: History reviewed. No pertinent family history.  Social History:  reports that she has been smoking Cigarettes.  She has been smoking about .5 packs per day. She does not have any smokeless tobacco history on file. She reports that she drinks about 14.4 ounces of alcohol per week. She reports that she uses illicit drugs (Cocaine).  Additional Social History:     CIWA: CIWA-Ar BP: 127/76 mmHg Pulse Rate: 80  COWS:    Allergies: No Known Allergies  Home Medications:  (Not in a hospital admission)  OB/GYN Status:  No LMP recorded.  General Assessment Data Location of Assessment: [facility] ACT Assessment: Yes Living Arrangements: Parent Can pt return to current living arrangement?: Yes Admission Status: Involuntary Is patient capable of signing voluntary admission?: Yes Transfer from: Acute Hospital Referral Source: MD  Education Status Is patient currently in school?: No  Risk to self Suicidal Ideation: No Suicidal Intent: No Is patient at risk for suicide?: No Suicidal Plan?: No Access to Means: No What has been your use of drugs/alcohol within the last 12 months?:  (chronic use) Previous Attempts/Gestures: No Intentional Self Injurious Behavior: None Family Suicide History: Unknown Recent stressful life event(s): Conflict (Comment);Financial Problems Persecutory voices/beliefs?: No Depression: Yes Depression Symptoms: Loss of interest in usual pleasures Substance abuse history and/or treatment for substance abuse?: Yes Suicide prevention  information given to non-admitted patients: Yes  Risk to Others Homicidal Ideation: No Thoughts of Harm to Others: No Current Homicidal Intent: No Current Homicidal Plan: No Access to Homicidal Means: No History of harm to others?: No Does patient  have access to weapons?: No Criminal Charges Pending?: No Does patient have a court date: No  Psychosis Hallucinations: None noted Delusions: None noted  Mental Status Report Appear/Hygiene: Disheveled Eye Contact: Poor Motor Activity: Psychomotor retardation;Unsteady Speech: Incoherent;Slow;Slurred Level of Consciousness: Drowsy;Sedated Mood: Worthless, low self-esteem Affect: Blunted;Preoccupied Anxiety Level: None Thought Processes: Circumstantial Judgement: Impaired Orientation: Person;Place Obsessive Compulsive Thoughts/Behaviors: Minimal  Cognitive Functioning Concentration: Decreased Memory: Recent Impaired IQ: Average Insight: Poor Impulse Control: Poor Appetite: Fair  ADLScreening [facility] Assessment Services) Patient's cognitive ability adequate to safely complete daily activities?: Yes Patient able to express need for assistance with ADLs?: Yes Independently performs ADLs?: Yes  Abuse/Neglect [facility]) Physical Abuse: Denies Verbal Abuse: Denies Sexual Abuse: Denies  Prior Inpatient Therapy Prior Inpatient Therapy: Yes Prior Therapy Dates:  (2013-referred to inpatient; she then backed out)  Prior Outpatient Therapy Prior Outpatient Therapy: Yes Prior Therapy Dates:  (current) Prior Therapy Facilty/Provider(s):  [place] Reason for Treatment:  (substance abuse)  ADL Screening (condition at time of admission) Patient's cognitive ability adequate to safely complete daily activities?: Yes Patient able to express need for assistance with ADLs?: Yes Independently performs ADLs?: Yes       Abuse/Neglect Assessment (Assessment to be complete while patient is alone) Physical Abuse: Denies Verbal Abuse: Denies Sexual Abuse: Denies          Additional Information 1:1 In Past 12 Months?: No CIRT Risk: No Elopement Risk: Yes Does patient have medical clearance?: No     Disposition:  Disposition Disposition of Patient: Inpatient  treatment program Type of inpatient treatment program: Adult Other disposition(s): Referred to outside facility  On Site Evaluation by:  Dr. Colon Branch Reviewed with Physician:  Dr. Colon Branch  Patient will be referred to [facility], [facility], [facility], and [facility]. However, patient is not medically clear at this time due to the intoxication.   Shon Baton H 12/28/2011 12:08 AM

## 2011-12-28 NOTE — ED Notes (Signed)
Patient ambulatory in hallway, very unsteady gait, stumbles very easily, falls asleep while sitting on commode.  Patient advised if she needs to get up to call for assistance.  Pt requests meal, meal provided, however, pt asleep before eating her meal.

## 2011-12-28 NOTE — ED Notes (Signed)
Paged Act team Felissa at 6:10

## 2011-12-28 NOTE — ED Notes (Addendum)
Pt remains very lethargic. No distress noted.  Following assessment by EDP, it was determined that pt was not suicidal, so no sitter is required at this time.  EDP also rescinded IVC papers.  Will continue to monitor pt for safety purposes, but not due to SI/HI.

## 2011-12-28 NOTE — ED Notes (Signed)
Pt awakens easily, confused as to how she got here and events prior.  Requests food and drink, provided to patient, however, was asleep upon my return into the room.

## 2012-01-30 ENCOUNTER — Encounter (HOSPITAL_COMMUNITY): Payer: Self-pay | Admitting: *Deleted

## 2012-01-30 ENCOUNTER — Emergency Department (HOSPITAL_COMMUNITY)
Admission: EM | Admit: 2012-01-30 | Discharge: 2012-01-31 | Disposition: A | Discharge status: Home / Self Care | Payer: Self-pay | Attending: Emergency Medicine | Admitting: Emergency Medicine

## 2012-01-30 DIAGNOSIS — F17200 Nicotine dependence, unspecified, uncomplicated: Secondary | ICD-10-CM | POA: Insufficient documentation

## 2012-01-30 DIAGNOSIS — Z79899 Other long term (current) drug therapy: Secondary | ICD-10-CM | POA: Insufficient documentation

## 2012-01-30 DIAGNOSIS — F1910 Other psychoactive substance abuse, uncomplicated: Secondary | ICD-10-CM | POA: Insufficient documentation

## 2012-01-30 DIAGNOSIS — F319 Bipolar disorder, unspecified: Secondary | ICD-10-CM | POA: Insufficient documentation

## 2012-01-30 DIAGNOSIS — F411 Generalized anxiety disorder: Secondary | ICD-10-CM | POA: Insufficient documentation

## 2012-01-30 LAB — CBC
HCT: 38.4 % (ref 36.0–46.0)
Hemoglobin: 13.5 g/dL (ref 12.0–15.0)
MCV: 98 fL (ref 78.0–100.0)
RBC: 3.92 MIL/uL (ref 3.87–5.11)
WBC: 12.7 10*3/uL — ABNORMAL HIGH (ref 4.0–10.5)

## 2012-01-30 LAB — POCT I-STAT, CHEM 8
Calcium, Ion: 1.09 mmol/L — ABNORMAL LOW (ref 1.12–1.23)
Creatinine, Ser: 1.2 mg/dL — ABNORMAL HIGH (ref 0.50–1.10)
Glucose, Bld: 83 mg/dL (ref 70–99)
HCT: 41 % (ref 36.0–46.0)
Hemoglobin: 13.9 g/dL (ref 12.0–15.0)

## 2012-01-30 LAB — ACETAMINOPHEN LEVEL: Acetaminophen (Tylenol), Serum: <15 ug/mL (ref 10–30)

## 2012-01-30 LAB — RAPID URINE DRUG SCREEN, HOSP PERFORMED
Amphetamines: NOT DETECTED
Barbiturates: NOT DETECTED
Benzodiazepines: POSITIVE — AB
Cocaine: POSITIVE — AB
Tetrahydrocannabinol: POSITIVE — AB

## 2012-01-30 LAB — ETHANOL: Alcohol, Ethyl (B): 147 mg/dL — ABNORMAL HIGH (ref 0–11)

## 2012-01-30 MED ORDER — NAPROXEN 250 MG PO TABS: 500.0000 mg | ORAL_TABLET | Freq: Two times a day (BID) | ORAL | Status: DC | PRN

## 2012-01-30 MED ORDER — QUETIAPINE FUMARATE 200 MG PO TABS
200.0000 mg | ORAL_TABLET | Freq: Every day | ORAL | Status: DC
  Administered 2012-01-30: 200 mg via ORAL
  Filled 2012-01-30: qty 1

## 2012-01-30 MED ORDER — ONDANSETRON 4 MG PO TBDP
4.0000 mg | ORAL_TABLET | Freq: Four times a day (QID) | ORAL | Status: DC | PRN
  Administered 2012-01-30: 4 mg via ORAL
  Filled 2012-01-30: qty 1

## 2012-01-30 MED ORDER — QUETIAPINE FUMARATE 200 MG PO TABS: 200.0000 mg | ORAL_TABLET | Freq: Every day | ORAL | Status: DC

## 2012-01-30 MED ORDER — CLONIDINE HCL 0.1 MG PO TABS
0.1000 mg | ORAL_TABLET | Freq: Four times a day (QID) | ORAL | Status: DC
  Filled 2012-01-30: qty 1

## 2012-01-30 MED ORDER — BUPRENORPHINE HCL 8 MG SL SUBL: 8.0000 mg | SUBLINGUAL_TABLET | Freq: Every day | SUBLINGUAL | Status: DC

## 2012-01-30 MED ORDER — IBUPROFEN 200 MG PO TABS: 600.0000 mg | ORAL_TABLET | Freq: Three times a day (TID) | ORAL | Status: DC | PRN

## 2012-01-30 MED ORDER — LOPERAMIDE HCL 2 MG PO CAPS: 2.0000 mg | ORAL_CAPSULE | ORAL | Status: DC | PRN

## 2012-01-30 MED ORDER — HYDROXYZINE HCL 25 MG PO TABS
25.0000 mg | ORAL_TABLET | Freq: Four times a day (QID) | ORAL | Status: DC | PRN
  Administered 2012-01-31: 25 mg via ORAL
  Filled 2012-01-30: qty 1

## 2012-01-30 MED ORDER — BUPRENORPHINE HCL 2 MG SL SUBL: 8.0000 mg | SUBLINGUAL_TABLET | Freq: Once | SUBLINGUAL | Status: DC

## 2012-01-30 MED ORDER — CLONIDINE HCL 0.1 MG PO TABS: 0.1000 mg | ORAL_TABLET | Freq: Every day | ORAL | Status: DC

## 2012-01-30 MED ORDER — LORAZEPAM 1 MG PO TABS: 1.0000 mg | ORAL_TABLET | Freq: Four times a day (QID) | ORAL | Status: DC | PRN

## 2012-01-30 MED ORDER — ACETAMINOPHEN 325 MG PO TABS: 650.0000 mg | ORAL_TABLET | ORAL | Status: DC | PRN

## 2012-01-30 MED ORDER — METHOCARBAMOL 500 MG PO TABS: 500.0000 mg | ORAL_TABLET | Freq: Three times a day (TID) | ORAL | Status: DC | PRN

## 2012-01-30 MED ORDER — CLONAZEPAM 0.5 MG PO TABS
0.5000 mg | ORAL_TABLET | Freq: Three times a day (TID) | ORAL | Status: DC
  Administered 2012-01-30 – 2012-01-31 (×4): 0.5 mg via ORAL
  Filled 2012-01-30 (×4): qty 1

## 2012-01-30 MED ORDER — NORTRIPTYLINE HCL 25 MG PO CAPS
25.0000 mg | ORAL_CAPSULE | Freq: Every day | ORAL | Status: DC
  Administered 2012-01-30: 25 mg via ORAL
  Filled 2012-01-30: qty 1

## 2012-01-30 MED ORDER — NICOTINE 21 MG/24HR TD PT24
21.0000 mg | MEDICATED_PATCH | Freq: Every day | TRANSDERMAL | Status: DC
  Administered 2012-01-30 – 2012-01-31 (×2): 21 mg via TRANSDERMAL
  Filled 2012-01-30 (×2): qty 1

## 2012-01-30 MED ORDER — DICYCLOMINE HCL 20 MG PO TABS
20.0000 mg | ORAL_TABLET | Freq: Four times a day (QID) | ORAL | Status: DC | PRN
Start: 2012-01-30 — End: 2012-01-31

## 2012-01-30 MED ORDER — CITALOPRAM HYDROBROMIDE 40 MG PO TABS
40.0000 mg | ORAL_TABLET | Freq: Every day | ORAL | Status: DC
  Administered 2012-01-30 – 2012-01-31 (×2): 40 mg via ORAL
  Filled 2012-01-30 (×2): qty 1

## 2012-01-30 MED ORDER — CLONIDINE HCL 0.1 MG PO TABS: 0.1000 mg | ORAL_TABLET | ORAL | Status: DC
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
## 2012-01-30 NOTE — ED Notes (Signed)
Sitter relieved for break 

## 2012-01-30 NOTE — ED Notes (Addendum)
meds given, no changes, resting, sitter at [facility]. BP med not given d/t other meds given and SBP soft (sBP recently 98 & 105)

## 2012-01-30 NOTE — ED Notes (Signed)
Talked to [place], Teacher, early years/pre. Subutex sublingual 8mg  not available in the pharmacy.  He may be able to get 2 mg sublingual for patient.

## 2012-01-30 NOTE — ED Notes (Signed)
Sitter at [facility], c/o mild nausea, given antiemetic, alert, amiable, polite, NAD, calm, interactive. Other meds to be given after nausea resolved.

## 2012-01-30 NOTE — ED Notes (Signed)
Regular diet ordered spoke with Tabitha 

## 2012-01-30 NOTE — ED Notes (Signed)
ACT team at bedside.  
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
## 2012-01-30 NOTE — ED Notes (Signed)
[facility]ge RN notified of need for sitter.

## 2012-01-30 NOTE — ED Notes (Signed)
Sitter relieved for lunch. 

## 2012-01-30 NOTE — ED Notes (Signed)
MD Knapp at bedside 
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
## 2012-01-31 MED ORDER — CITALOPRAM HYDROBROMIDE 20 MG PO TABS: 20.0000 mg | ORAL_TABLET | Freq: Every day | ORAL | Status: DC

## 2012-01-31 MED ORDER — CLONAZEPAM 0.5 MG PO TABS: 0.5000 mg | ORAL_TABLET | Freq: Three times a day (TID) | ORAL | Status: DC | PRN

## 2012-01-31 MED ORDER — DIVALPROEX SODIUM 500 MG PO DR TAB: 1500.0000 mg | DELAYED_RELEASE_TABLET | Freq: Three times a day (TID) | ORAL | Status: DC

## 2012-01-31 NOTE — ED Notes (Signed)
Patient is resting comfortably. Sitter bedside.

## 2012-01-31 NOTE — ED Notes (Signed)
Patient is resting comfortably. Sitter at bedside.  

## 2012-01-31 NOTE — Discharge Instructions (Signed)
We saw you in the ER for your mental health concerns and had our behavioral health team evaluate you. The team feels comfortable sending you home, please follow the recommendations given to you by them and the follow-up and medications they have prescribed to you. Please refrain from substance abuse. Return to the ER if your symptoms worsen.  RESOURCE GUIDE  Chronic Pain Problems: Contact [facility]  [phone] Patients need to be referred by their primary care doctor.  Insufficient Money for Medicine: Contact United Way:  call "211" or [facility] [phone].  No Primary Care Doctor: - Call [facility]  [phone] - can help you locate a primary care doctor that  accepts your insurance, provides certain services, etc. - Physician Referral Service- [phone]  Agencies that provide inexpensive medical care: - [facility]  [phone] - [facility]  [phone] - [facility]  [phone] - Women's Clinic  [phone] - Planned Parenthood  [phone] [facility]  [phone]  Medicaid-accepting [facility] Providers: - [facility]- [address]  [phone], Mon-Fri 9am-7pm, Sat 9am-1pm - [facility]- [address] [place], [address] [place]  [phone] - [facility]- [address] [place]  [phone] [facility]- [address]  [phone] - Renaye Rakers- [address] [place], [address], [phone]  Only accepts [place] patients after they have their name  applied to their card  Self Pay (no insurance) in [place]: - Sickle Cell Patients: Dr Willey Blade, [facility]  [address] [place], [phone] - [facility]- [address] [place]  [phone]       [facility]- [address]       -     [facility]- see  information above (Speak to [employer] if you do not have insurance)       -  [facility]- [address] [place], [phone]       -  [facility]- [address],  [phone]       -  [facility]- [address], [phone]       -  Dr Julio Sicks-  [address], [place], [phone]       -  [facility]- [address], [phone]       -  [facility]- [address], [phone], also [address], [phone]       -    [facility]- [address] [place], [phone], 1st & 3rd Saturday   every month, 10am-1pm  1) Find a Doctor and Pay Out of Pocket Although you won't have to find out who is covered by your insurance plan, it is a good idea to ask around and get recommendations. You will then need to call the office and see if the doctor you have chosen will accept you as a new patient and what types of options they offer for patients who are self-pay. Some doctors offer discounts or will set up payment plans for their patients who do not have insurance, but you will need to ask so you aren't surprised when you get to your appointment.  2) Contact Your Local Health Department Not all health departments have  doctors that can see patients for sick visits, but many do, so it is worth a call to see if yours does. If you don't know where your local health department is, you can check in your phone book. The CDC also has a tool to help you locate your state's health department, and many state websites also have listings of all of their local health departments.  3) Find a Walk-in Clinic If your illness is not likely to be very severe or complicated, you may want to try a walk in clinic. These are popping up all over the country in pharmacies, drugstores, and shopping centers. They're usually staffed by nurse practitioners or physician assistants that have been trained to treat common illnesses and complaints. They're usually fairly  quick and inexpensive. However, if you have serious medical issues or chronic medical problems, these are probably not your best option  STD Testing - [facility] Department of [facility] [place], STD Clinic, [address] [place], phone [phone] or [phone].  Monday - Friday, call for an appointment. [facility] Department of [employer], STD Clinic, [address], [place], phone [phone] or [phone].  Monday - Friday, call for an appointment.  Abuse/Neglect: [facility] Child Abuse Hotline [phone] [facility] Child Abuse Hotline (365)729-5594 (After Hours)  Emergency Shelter:  [facility] 813-680-9045  Maternity Homes: - [facility] at [facility] [place] [facility] (514)675-1332 - [facility] [phone]  MRSA Hotline #:   (509)859-6698  [facility] Resources  [facility] [place]  United Way [facility] Dept. [address]                 [address]         [address] [place] [address]  Blondell Reveal Phone:  [phone]                                  Phone:  [phone]                   Phone:  [phone]  [facility] Mental Health, [phone] - [phone]       -     [facility] in [place], [address],                                  [phone], [facility] Child Abuse Hotline (782)508-4024 or 319 004 4968 (After Hours)   Behavioral Health Services  Substance Abuse Resources: - Alcohol and Drug Services  320-746-3149 - [facility] [phone] - [facility] [place] (423)305-4162 Floydene Flock [phone] - Residential & Outpatient Substance Abuse Program  [phone]  Psychological Services: [facility]   3516241132 Services  [phone] - [facility], [phone]  [address], [place], ACCESS LINE: 873-816-3547 or 712-403-7894, [employer]  Dental Assistance  If unable to pay or uninsured, contact:  [facility] or [facility]. to become qualified for the adult dental clinic.  Patients with Medicaid: [facility] [phone] [address], [phone] [address] [phone]  If unable to pay, or uninsured, contact [facility] [phone]) or [facility] Department [phone]) [phone] in [place], [phone] in [facility]) to become qualified for the adult dental clinic  Other Low-Cost Community Dental Services: - [facility]- [address] [place], [place], [place], [address], [phone], [address], 2nd and 4th Thursday of the month at 6:30am.  10 clients each day by appointment, can sometimes see walk-in patients if someone does not show for an appointment. [facility]- [address] Ether Griffins [place], [place], [address], [phone] - [facility]- [address], [place], [place], [address], [phone] [facility] Health Department- [phone] [facility] Department- [phone] [facility] [phone]     Drug Abuse and Addiction in Sports There are many types of drugs that one may become addicted to including illegal drugs (marijuana, cocaine, amphetamines, hallucinogens, and narcotics), prescription drugs (hydrocodone, codeine, and alprazolam), and other chemicals such as alcohol or nicotine. Two types of addiction exist: physical and emotional. Physical addiction usually occurs after prolonged use of a drug. However, some drugs may only take a couple uses before addiction can occur. Physical addiction is marked by withdrawal symptoms, in which the person experiences negative symptoms such as sweat, anxiety, tremors, hallucinations, or  cravings in the absence of using the drug. Emotional dependence is the psychological desire for the "high" that the drugs produce when taken. SYMPTOMS   Inattentiveness.   Negligence.   Forgetfulness.   Insomnia.   Mood swings.  RISK INCREASES WITH:   Family history of addiction.   Personal history of addictive personality.  Studies have shown that risktakers, which many athletes are, have a higher risk of addiction. PREVENTION The only adequate prevention of drug abuse is abstinence from drugs. TREATMENT  The first step in quitting substance abuse is recognizing the problem and realizing that one has the power to change. Quitting requires a plan and support from others. It is often necessary to seek medical assistance. Caregivers are available to offer counseling, and for certain cases, medicine to diminish the physical symptoms of withdrawal. Many organizations exist such as Alcoholics Anonymous, Narcotics Anonymous, or the [employer] on Alcoholism that offer support for individuals who have chosen to quit their habits. Document Released: 06/09/2005 Document Revised: 05/29/2011 Document Reviewed: 09/21/2008 [facility] Patient Information 2012 [place], [place].

## 2012-01-31 NOTE — ED Provider Notes (Signed)
Psych clears patient, recommending psyhiatric meds and outpatient f/u. Will provide resources and the prescriptions.   Derwood Kaplan, MD 01/31/12 1558
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
## 2012-11-09 ENCOUNTER — Encounter (HOSPITAL_COMMUNITY): Payer: Self-pay | Admitting: Emergency Medicine

## 2012-11-09 ENCOUNTER — Emergency Department (HOSPITAL_COMMUNITY)
Admission: EM | Admit: 2012-11-09 | Discharge: 2012-11-09 | Disposition: A | Discharge status: Home / Self Care | Payer: Self-pay | Attending: Emergency Medicine | Admitting: Emergency Medicine

## 2012-11-09 ENCOUNTER — Emergency Department (HOSPITAL_COMMUNITY): Payer: Self-pay

## 2012-11-09 DIAGNOSIS — F411 Generalized anxiety disorder: Secondary | ICD-10-CM | POA: Insufficient documentation

## 2012-11-09 DIAGNOSIS — G40909 Epilepsy, unspecified, not intractable, without status epilepticus: Secondary | ICD-10-CM | POA: Insufficient documentation

## 2012-11-09 DIAGNOSIS — S0990XA Unspecified injury of head, initial encounter: Secondary | ICD-10-CM | POA: Insufficient documentation

## 2012-11-09 DIAGNOSIS — F17200 Nicotine dependence, unspecified, uncomplicated: Secondary | ICD-10-CM | POA: Insufficient documentation

## 2012-11-09 DIAGNOSIS — Z3202 Encounter for pregnancy test, result negative: Secondary | ICD-10-CM | POA: Insufficient documentation

## 2012-11-09 DIAGNOSIS — F319 Bipolar disorder, unspecified: Secondary | ICD-10-CM | POA: Insufficient documentation

## 2012-11-09 DIAGNOSIS — Z79899 Other long term (current) drug therapy: Secondary | ICD-10-CM | POA: Insufficient documentation

## 2012-11-09 DIAGNOSIS — F1910 Other psychoactive substance abuse, uncomplicated: Secondary | ICD-10-CM | POA: Insufficient documentation

## 2012-11-09 DIAGNOSIS — S298XXA Other specified injuries of thorax, initial encounter: Secondary | ICD-10-CM | POA: Insufficient documentation

## 2012-11-09 DIAGNOSIS — S5001XA Contusion of right elbow, initial encounter: Secondary | ICD-10-CM

## 2012-11-09 DIAGNOSIS — S5000XA Contusion of unspecified elbow, initial encounter: Secondary | ICD-10-CM | POA: Insufficient documentation

## 2012-11-09 LAB — POCT PREGNANCY, URINE: Preg Test, Ur: NEGATIVE

## 2012-11-09 LAB — RAPID URINE DRUG SCREEN, HOSP PERFORMED: Amphetamines: NOT DETECTED

## 2012-11-09 MED ORDER — IBUPROFEN 600 MG PO TABS: 600.0000 mg | ORAL_TABLET | Freq: Four times a day (QID) | ORAL | Status: DC | PRN
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
## 2012-11-11 NOTE — ED Provider Notes (Signed)
Medical screening examination/treatment/procedure(s) were performed by non-physician practitioner and as supervising physician I was immediately available for consultation/collaboration.   Christopher J. Pollina, MD 11/11/12 2320 

## 2012-11-14 ENCOUNTER — Emergency Department (HOSPITAL_COMMUNITY)
Admission: EM | Admit: 2012-11-14 | Discharge: 2012-11-15 | Disposition: A | Discharge status: Home / Self Care | Payer: Self-pay | Attending: Dermatology | Admitting: Dermatology

## 2012-11-14 ENCOUNTER — Encounter (HOSPITAL_COMMUNITY): Payer: Self-pay | Admitting: Emergency Medicine

## 2012-11-14 DIAGNOSIS — F17200 Nicotine dependence, unspecified, uncomplicated: Secondary | ICD-10-CM | POA: Insufficient documentation

## 2012-11-14 DIAGNOSIS — Z8619 Personal history of other infectious and parasitic diseases: Secondary | ICD-10-CM | POA: Insufficient documentation

## 2012-11-14 DIAGNOSIS — F419 Anxiety disorder, unspecified: Secondary | ICD-10-CM

## 2012-11-14 DIAGNOSIS — Z79899 Other long term (current) drug therapy: Secondary | ICD-10-CM | POA: Insufficient documentation

## 2012-11-14 DIAGNOSIS — F1910 Other psychoactive substance abuse, uncomplicated: Secondary | ICD-10-CM

## 2012-11-14 DIAGNOSIS — F411 Generalized anxiety disorder: Secondary | ICD-10-CM | POA: Insufficient documentation

## 2012-11-14 DIAGNOSIS — F10929 Alcohol use, unspecified with intoxication, unspecified: Secondary | ICD-10-CM

## 2012-11-14 DIAGNOSIS — F319 Bipolar disorder, unspecified: Secondary | ICD-10-CM | POA: Insufficient documentation

## 2012-11-14 DIAGNOSIS — F1010 Alcohol abuse, uncomplicated: Secondary | ICD-10-CM | POA: Insufficient documentation

## 2012-11-14 DIAGNOSIS — R569 Unspecified convulsions: Secondary | ICD-10-CM | POA: Insufficient documentation

## 2012-11-14 HISTORY — DX: Unspecified viral hepatitis C without hepatic coma: B19.20

## 2012-11-14 LAB — COMPREHENSIVE METABOLIC PANEL
ALT: 53 U/L — ABNORMAL HIGH (ref 0–35)
AST: 44 U/L — ABNORMAL HIGH (ref 0–37)
CO2: 21 mEq/L (ref 19–32)
Chloride: 108 mEq/L (ref 96–112)
Creatinine, Ser: 0.72 mg/dL (ref 0.50–1.10)
GFR calc non Af Amer: >90 mL/min (ref >90)
Total Bilirubin: 0.1 mg/dL — ABNORMAL LOW (ref 0.3–1.2)

## 2012-11-14 LAB — CBC WITH DIFFERENTIAL/PLATELET
Basophils Absolute: 0 10*3/uL (ref 0.0–0.1)
HCT: 43.6 % (ref 36.0–46.0)
Hemoglobin: 14.9 g/dL (ref 12.0–15.0)
Lymphocytes Relative: 36 % (ref 12–46)
Monocytes Absolute: 0.5 10*3/uL (ref 0.1–1.0)
Neutro Abs: 5.3 10*3/uL (ref 1.7–7.7)
RBC: 4.5 MIL/uL (ref 3.87–5.11)
RDW: 12.9 % (ref 11.5–15.5)
WBC: 9.2 10*3/uL (ref 4.0–10.5)

## 2012-11-14 LAB — RAPID URINE DRUG SCREEN, HOSP PERFORMED
Barbiturates: NOT DETECTED
Cocaine: NOT DETECTED

## 2012-11-14 LAB — ETHANOL: Alcohol, Ethyl (B): 265 mg/dL — ABNORMAL HIGH (ref 0–11)

## 2012-11-14 MED ORDER — LORAZEPAM 1 MG PO TABS
1.0000 mg | ORAL_TABLET | Freq: Three times a day (TID) | ORAL | Status: DC | PRN
  Administered 2012-11-15: 1 mg via ORAL
  Filled 2012-11-14: qty 1

## 2012-11-14 MED ORDER — ACETAMINOPHEN 325 MG PO TABS
650.0000 mg | ORAL_TABLET | ORAL | Status: DC | PRN
  Administered 2012-11-15: 650 mg via ORAL
  Filled 2012-11-14: qty 2

## 2012-11-14 MED ORDER — NICOTINE 21 MG/24HR TD PT24: 21.0000 mg | MEDICATED_PATCH | Freq: Every day | TRANSDERMAL | Status: DC

## 2012-11-14 MED ORDER — ONDANSETRON HCL 4 MG PO TABS: 4.0000 mg | ORAL_TABLET | Freq: Three times a day (TID) | ORAL | Status: DC | PRN

## 2012-11-14 NOTE — ED Provider Notes (Signed)
History     CSN: [id number]  Arrival date & time 11/14/12  1949   First MD Initiated Contact with Patient 11/14/12 2007      Chief Complaint  Patient presents with  . V70.1    (Consider location/radiation/quality/duration/timing/severity/associated sxs/prior treatment) The history is provided by the patient.  pt with hx bipolar disorder, etoh abuse, presents w Patent examiner. Pt told parent that she was very depressed, and wanted to die. Pt notes drank several beers throughout course of day starting this morning. Pt states that she drinks daily. When stops will feel shaky, but denies hx dts. States hx szs unrelated to etoh abuse/withdrawal.  States not taking any of her meds for several months. Pt indicates has a lot of stressors in life, denies single specific exacerbating event. States fleeting thoughts of self harm/suicide, but deies specific plan or attempt.     Past Medical History  Diagnosis Date  . Seizures   . Bipolar 1 disorder   . Anxiety   . Hepatitis C     Past Surgical History  Procedure Laterality Date  . Tubal ligation      History reviewed. No pertinent family history.  History  Substance Use Topics  . Smoking status: Current Every Day Smoker -- 0.50 packs/day    Types: Cigarettes  . Smokeless tobacco: Not on file  . Alcohol Use: 1.8 oz/week    3 Cans of beer per week     Comment: daily    OB History   Grav Para Term Preterm Abortions TAB SAB Ect Mult Living                  Review of Systems  Constitutional: Negative for fever.  HENT: Negative for neck pain.   Eyes: Negative for pain.  Respiratory: Negative for cough and shortness of breath.   Cardiovascular: Negative for chest pain.  Gastrointestinal: Negative for vomiting, abdominal pain and diarrhea.  Genitourinary: Negative for flank pain.  Musculoskeletal: Negative for back pain.  Skin: Negative for rash.  Neurological: Negative for headaches.  Hematological: Does not bruise/bleed  easily.  Psychiatric/Behavioral: Positive for dysphoric mood.    Allergies  Review of patient's allergies indicates no known allergies.  Home Medications   Current Outpatient Rx  Name  Route  Sig  Dispense  Refill  . buprenorphine-naloxone (SUBOXONE) 8-2 MG SUBL   Sublingual   Place 1 tablet under the tongue every 12 (twelve) hours.         . citalopram (CELEXA) 20 MG tablet   Oral   Take 1 tablet (20 mg total) by mouth daily.   30 tablet   0   . clonazePAM (KLONOPIN) 0.5 MG tablet   Oral   Take 0.5 mg by mouth 3 (three) times daily.         Marland Kitchen EXPIRED: clonazePAM (KLONOPIN) 0.5 MG tablet   Oral   Take 1 tablet (0.5 mg total) by mouth 3 (three) times daily as needed for anxiety.   30 tablet   0   . divalproex (DEPAKOTE ER) 500 MG 24 hr tablet   Oral   Take 1,500 mg by mouth daily. Pt takes 1 tablet in the morning and 2 at night         . divalproex (DEPAKOTE) 500 MG DR tablet   Oral   Take 3 tablets (1,500 mg total) by mouth 3 (three) times daily.   90 tablet   0   . ibuprofen (ADVIL,MOTRIN) 600 MG tablet  Oral   Take 1 tablet (600 mg total) by mouth every 6 (six) hours as needed for pain.   30 tablet   0   . nortriptyline (PAMELOR) 25 MG capsule   Oral   Take 25 mg by mouth at bedtime.           LMP 10/19/2012  Physical Exam  Nursing note and vitals reviewed. Constitutional: She is oriented to person, place, and time. She appears well-developed and well-nourished. No distress.  HENT:  Head: Atraumatic.  Nose: Nose normal.  Mouth/Throat: Oropharynx is clear and moist.  Eyes: Conjunctivae are normal. Pupils are equal, round, and reactive to light. No scleral icterus.  Neck: Neck supple. No tracheal deviation present.  Cardiovascular: Normal rate, regular rhythm, normal heart sounds and intact distal pulses.   Pulmonary/Chest: Effort normal and breath sounds normal. No respiratory distress.  Abdominal: Soft. Normal appearance and bowel sounds  are normal. She exhibits no distension. There is no tenderness.  Musculoskeletal: She exhibits no edema and no tenderness.  Neurological: She is alert and oriented to person, place, and time.  Motor intact bil. Steady gait.   Skin: Skin is warm and dry. No rash noted.  Psychiatric:  Tearful.     ED Course  Procedures (including critical care time)  Results for orders placed during the hospital encounter of 11/14/12  CBC WITH DIFFERENTIAL      Result Value Range   WBC 9.2  4.0 - 10.5 K/uL   RBC 4.50  3.87 - 5.11 MIL/uL   Hemoglobin 14.9  12.0 - 15.0 g/dL   HCT 40.9  81.1 - 91.4 %   MCV 96.9  78.0 - 100.0 fL   MCH 33.1  26.0 - 34.0 pg   MCHC 34.2  30.0 - 36.0 g/dL   RDW 78.2  95.6 - 21.3 %   Platelets 229  150 - 400 K/uL   Neutrophils Relative % 57  43 - 77 %   Neutro Abs 5.3  1.7 - 7.7 K/uL   Lymphocytes Relative 36  12 - 46 %   Lymphs Abs 3.3  0.7 - 4.0 K/uL   Monocytes Relative 5  3 - 12 %   Monocytes Absolute 0.5  0.1 - 1.0 K/uL   Eosinophils Relative 2  0 - 5 %   Eosinophils Absolute 0.1  0.0 - 0.7 K/uL   Basophils Relative 0  0 - 1 %   Basophils Absolute 0.0  0.0 - 0.1 K/uL  COMPREHENSIVE METABOLIC PANEL      Result Value Range   Sodium 142  135 - 145 mEq/L   Potassium 4.2  3.5 - 5.1 mEq/L   Chloride 108  96 - 112 mEq/L   CO2 21  19 - 32 mEq/L   Glucose, Bld 103 (*) 70 - 99 mg/dL   BUN 7  6 - 23 mg/dL   Creatinine, Ser 0.86  0.50 - 1.10 mg/dL   Calcium 8.5  8.4 - 57.8 mg/dL   Total Protein 7.7  6.0 - 8.3 g/dL   Albumin 4.2  3.5 - 5.2 g/dL   AST 44 (*) 0 - 37 U/L   ALT 53 (*) 0 - 35 U/L   Alkaline Phosphatase 38 (*) 39 - 117 U/L   Total Bilirubin 0.1 (*) 0.3 - 1.2 mg/dL   GFR calc non Af Amer >90  >90 mL/min   GFR calc Af Amer >90  >90 mL/min  URINE RAPID DRUG SCREEN (HOSP PERFORMED)  Result Value Range   Opiates POSITIVE (*) NONE DETECTED   Cocaine NONE DETECTED  NONE DETECTED   Benzodiazepines NONE DETECTED  NONE DETECTED   Amphetamines NONE DETECTED   NONE DETECTED   Tetrahydrocannabinol POSITIVE (*) NONE DETECTED   Barbiturates NONE DETECTED  NONE DETECTED  ETHANOL      Result Value Range   Alcohol, Ethyl (B) 265 (*) 0 - 11 mg/dL       MDM  Labs.   Reviewed nursing notes and prior charts for additional history.   Recheck pt calm, alert. No tremor or shakes.   telepsych pending.  Signed out to Dr Hyacinth Meeker to f/u with telepsych eval, recheck pt, and dispo appropriately.       Suzi Roots, MD 11/14/12 2154

## 2012-11-14 NOTE — ED Notes (Addendum)
Pt do department by [facility].  Pt reports long history of depression.  States "If I'm left alone, I'm really afraid I'll kill myself."  Pt reports long history of ETOH abuse as well.  Pt is in department on voluntary basis at this time, requesting treatment for depression.    Sitter precautions in place, room secured.

## 2012-11-14 NOTE — ED Notes (Signed)
Sitter initiated. Pt states she should have just stayed at home and kept her mouth shut, she wouldn't have to be up here. States that she has to go to court in three days and hopefully this will help her.

## 2012-11-14 NOTE — ED Notes (Signed)
Patient reports being depressed recently and having recurring issues with depression and stress. Patient reports she was having thoughts of killing herself and states will kill herself if she is released from here. Also reports is daily drinker.

## 2012-11-15 LAB — PREGNANCY, URINE: Preg Test, Ur: NEGATIVE

## 2012-11-15 LAB — URINALYSIS, ROUTINE W REFLEX MICROSCOPIC
Bilirubin Urine: NEGATIVE
Hgb urine dipstick: NEGATIVE
Protein, ur: NEGATIVE mg/dL
Urobilinogen, UA: 0.2 mg/dL (ref 0.0–1.0)

## 2012-11-15 NOTE — BH Assessment (Signed)
Assessment Note   [name] is an 32 y.o. female. Patient has a long, chronic history of substance abuse. She reports that she has been drinking this holiday weekend; on Sunday, she drank a 6 pack of beer. States she also uses cannabis, and last used on Saturday, one joint. She denies using any cocaine, but her previous ED visit shows she was positive for cocaine abuse. She admits to using hydrocodone yesterday as well, but she is not able to tell me how many tablets. She reports that her last treatment program was in August, 2013 in [place], [place]. She reports that she was able to stay clean two months after she left the program, which she was discharged from for violating their rules. Her longest period of sobriety was for about 7-8 months, when she was pregnant. He mother has custody of her children. Asked if she would like to go to detox or a substance abuse treatment program, and she replied no. Discussed with her a halfway house placement, a controlled environment that would allow her to develop appropriate coping mechanisms to various stressors. She declined that information as well. She stated that "I can do it on my own." When asked if she was ready, she replied; "Yes." She also indicated that she was sick and tired, but when I attempted to encourage her to consider treatment, she stated, "I don't need it, It's a waste of time; I can do it on my own."  She has a history of depression, which is poorly treated due to her inability to pay for the medications she needs. She reports that she lives alone; in a cabin behind her father's home. 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 She also reports that her boyfriend of 3-4 years has just come up positive for HEP C, she  Has attempted to go the health Department to be tested, but was told they wouldn't do it and she was referred to go give blood, but they couldn't  assist her either, as their was an issue with her veins being too small, etc. The patient denies having any thoughts of suicide at this time. Asked specifically if she had a plan or any intent to harm herself, she replied that she did not. She also denied any thoughts of wanting to harm others. She stated that she feels better now; and likely she does, since she no longer has the alcohol in her system. We discussed how drinking is compounding her depression and the need for her to consider abstinence, along with medication and therapy to assist her with the treatment of her depression. She nodded and declined inpatient treatment. She does not meet criteria at this time for involuntary commitment. Discussed with Dr. Estell Harpin, who agrees that the patient will be referred to outpatient clinic for follow up.   Axis I: Bipolar, Depressed, by history Axis II: Deferred Axis III: Hx of Seizures; needs to R/I or R/O Hep C, tubal ligation Axis IV: Medical stressors concerning whether she may have Hep C and accessing appropriate services; financial stressors Axis V: GAF 50-60   Past Medical History:  Past Medical History  Diagnosis Date  . Seizures   . Bipolar 1 disorder   . Anxiety   . Hepatitis C     Past Surgical History  Procedure Laterality Date  . Tubal ligation      Family History:  History reviewed. No pertinent family history.  Social History:  reports that she has been smoking Cigarettes.  She has been smoking about 0.50 packs per day. She does not have any smokeless tobacco history on file. She reports that she drinks about 1.8 ounces of alcohol per week. She reports that she uses illicit drugs (Cocaine, Marijuana, Benzodiazepines, and Opium).  Additional Social History:     CIWA: CIWA-Ar BP: 105/62 mmHg Pulse Rate: 67 Nausea and Vomiting: no nausea and no vomiting Tactile Disturbances: mild itching, pins and needles, burning or numbness Tremor: not visible, but can be felt fingertip  to fingertip Auditory Disturbances: not present Paroxysmal Sweats: two Visual Disturbances: not present Anxiety: two Headache, Fullness in Head: very mild Agitation: normal activity Orientation and Clouding of Sensorium: oriented and can do serial additions CIWA-Ar Total: 8 COWS:    Allergies: No Known Allergies  Home Medications:  (Not in a hospital admission)  OB/GYN Status:  Patient's last menstrual period was 10/19/2012.  General Assessment Data Location of Assessment: [facility] ACT Assessment: Yes Living Arrangements: Alone Can pt return to current living arrangement?: Yes Admission Status: Voluntary Is patient capable of signing voluntary admission?: Yes Referral Source: MD  Education Status Is patient currently in school?: No  Risk to self Suicidal Ideation: No Suicidal Intent: No Is patient at risk for suicide?: No Suicidal Plan?: No Access to Means: No What has been your use of drugs/alcohol within the last 12 months?:  (chronic, daily abuse) Previous Attempts/Gestures: Yes How many times?:  (1) Other Self Harm Risks:  (abusing drugs) Triggers for Past Attempts: Other personal contacts;Anniversary Intentional Self Injurious Behavior: None Family Suicide History: Yes (sister) Recent stressful life event(s): Loss (Comment);Financial Problems Persecutory voices/beliefs?: No Depression: Yes Depression Symptoms: Loss of interest in usual pleasures Substance abuse history and/or treatment for substance abuse?: Yes Suicide prevention information given to non-admitted patients: Yes  Risk to Others Homicidal Ideation: No Thoughts of Harm to Others: No Current Homicidal Intent: No Current Homicidal Plan: No Access to Homicidal Means: No History of harm to others?: No Assessment of Violence: In distant past Violent Behavior Description:  (calm, cooperative) Does patient have access to weapons?: No Criminal Charges Pending?: No Does patient have a court date:  No  Psychosis Hallucinations: None noted Delusions: None noted  Mental Status Report Appear/Hygiene: Disheveled Eye Contact: Good Motor Activity: Freedom of movement Speech: Logical/coherent Level of Consciousness: Alert Mood: Depressed Affect: Appropriate to circumstance Anxiety Level: Minimal Thought Processes: Relevant Judgement: Unimpaired Orientation: Person;Place;Time;Situation;Appropriate for developmental age Obsessive Compulsive Thoughts/Behaviors: Minimal  Cognitive Functioning Concentration: Decreased Memory: Recent Intact;Remote Intact IQ: Average Insight: Good Impulse Control: Good Appetite: Fair Sleep: Decreased Total Hours of Sleep: 6 Vegetative Symptoms: None  ADLScreening [facility] Assessment Services) Patient's cognitive ability adequate to safely complete daily activities?: Yes Patient able to express need for assistance with ADLs?: Yes Independently performs ADLs?: Yes (appropriate for developmental age)  Abuse/Neglect [facility]) Physical Abuse: Denies Verbal Abuse: Denies Sexual Abuse: Denies  Prior Inpatient Therapy Prior Inpatient Therapy: Yes Prior Therapy Facilty/Provider(s):  ([facility], [facility],  [facility] in [place], ) Reason for Treatment:  (Depression, SI, Substance Abuse)  Prior Outpatient Therapy Prior Outpatient Therapy: Yes Prior Therapy Dates:  (none currently) Reason for Treatment:  (depression, SA)  ADL Screening (condition at time of admission) Patient's cognitive ability adequate to safely complete daily activities?: Yes Patient able to express need for assistance with ADLs?: Yes Independently performs ADLs?: Yes (appropriate for developmental age)  Abuse/Neglect Assessment (Assessment to be complete while patient is alone) Physical Abuse: Denies Verbal Abuse: Denies Sexual Abuse: Denies Values / Beliefs Cultural Requests During Hospitalization: None Spiritual Requests During Hospitalization: None        Additional  Information 1:1 In Past 12 Months?: No CIRT Risk: No Elopement Risk: No Does patient have medical clearance?: Yes     Disposition:  Disposition Initial Assessment Completed for this Encounter: Yes Disposition of Patient: Outpatient treatment Type of outpatient treatment: Adult  On Site Evaluation by:  Dr. Estell Harpin Reviewed with Physician:  Dr. Cornelius Moras, Marlyce Mcdougald H 11/15/2012 8:23 AM

## 2012-11-15 NOTE — ED Notes (Signed)
Patients vital signs were taken WNL. Patient is resting a tt his time. Equal rise and fall of chest.

## 2012-11-15 NOTE — ED Notes (Signed)
Telepsych has recommend inpatient placement.  ACT team aware and to see pt in the morning.

## 2012-11-15 NOTE — BH Assessment (Signed)
[facility] Assessment Progress Note      Gave patient a list of STD/Infectious Disease testing sites in [facility] to assist her with finding out whether she has Hep C. Patient signed no harm clause; copy given to patient and one placed in chart to be scanned.  Shon Baton, MSW, LCSW, LCASA, CSW-G

## 2012-11-15 NOTE — ED Notes (Signed)
Telepsych assessment in process 

## 2012-11-15 NOTE — ED Provider Notes (Signed)
telepsych recommends admit Will call ACT  Joya Gaskins, MD 11/15/12 0201

## 2013-02-10 ENCOUNTER — Encounter (HOSPITAL_COMMUNITY): Payer: Self-pay | Admitting: *Deleted

## 2013-02-10 ENCOUNTER — Emergency Department (HOSPITAL_COMMUNITY)
Admission: EM | Admit: 2013-02-10 | Discharge: 2013-02-11 | Disposition: A | Discharge status: Home / Self Care | Payer: Self-pay | Attending: Emergency Medicine | Admitting: Emergency Medicine

## 2013-02-10 DIAGNOSIS — K8050 Calculus of bile duct without cholangitis or cholecystitis without obstruction: Secondary | ICD-10-CM

## 2013-02-10 DIAGNOSIS — F411 Generalized anxiety disorder: Secondary | ICD-10-CM | POA: Insufficient documentation

## 2013-02-10 DIAGNOSIS — K8020 Calculus of gallbladder without cholecystitis without obstruction: Secondary | ICD-10-CM | POA: Insufficient documentation

## 2013-02-10 DIAGNOSIS — R1031 Right lower quadrant pain: Secondary | ICD-10-CM | POA: Insufficient documentation

## 2013-02-10 DIAGNOSIS — M2550 Pain in unspecified joint: Secondary | ICD-10-CM | POA: Insufficient documentation

## 2013-02-10 DIAGNOSIS — Z9851 Tubal ligation status: Secondary | ICD-10-CM | POA: Insufficient documentation

## 2013-02-10 DIAGNOSIS — IMO0001 Reserved for inherently not codable concepts without codable children: Secondary | ICD-10-CM | POA: Insufficient documentation

## 2013-02-10 DIAGNOSIS — R1011 Right upper quadrant pain: Secondary | ICD-10-CM

## 2013-02-10 DIAGNOSIS — F319 Bipolar disorder, unspecified: Secondary | ICD-10-CM | POA: Insufficient documentation

## 2013-02-10 DIAGNOSIS — F17200 Nicotine dependence, unspecified, uncomplicated: Secondary | ICD-10-CM | POA: Insufficient documentation

## 2013-02-10 DIAGNOSIS — R079 Chest pain, unspecified: Secondary | ICD-10-CM | POA: Insufficient documentation

## 2013-02-10 DIAGNOSIS — Z8669 Personal history of other diseases of the nervous system and sense organs: Secondary | ICD-10-CM | POA: Insufficient documentation

## 2013-02-10 DIAGNOSIS — R109 Unspecified abdominal pain: Secondary | ICD-10-CM

## 2013-02-10 DIAGNOSIS — F1010 Alcohol abuse, uncomplicated: Secondary | ICD-10-CM | POA: Insufficient documentation

## 2013-02-10 DIAGNOSIS — R112 Nausea with vomiting, unspecified: Secondary | ICD-10-CM | POA: Insufficient documentation

## 2013-02-10 DIAGNOSIS — Z79899 Other long term (current) drug therapy: Secondary | ICD-10-CM | POA: Insufficient documentation

## 2013-02-10 DIAGNOSIS — Z8619 Personal history of other infectious and parasitic diseases: Secondary | ICD-10-CM | POA: Insufficient documentation

## 2013-02-10 DIAGNOSIS — R195 Other fecal abnormalities: Secondary | ICD-10-CM | POA: Insufficient documentation

## 2013-02-10 LAB — CBC WITH DIFFERENTIAL/PLATELET
Basophils Absolute: 0 10*3/uL (ref 0.0–0.1)
Basophils Relative: 0 % (ref 0–1)
Eosinophils Absolute: 0.2 10*3/uL (ref 0.0–0.7)
Eosinophils Relative: 2 % (ref 0–5)
HCT: 40.9 % (ref 36.0–46.0)
MCH: 34 pg (ref 26.0–34.0)
MCHC: 35 g/dL (ref 30.0–36.0)
MCV: 97.4 fL (ref 78.0–100.0)
Monocytes Absolute: 0.8 10*3/uL (ref 0.1–1.0)
RDW: 12.8 % (ref 11.5–15.5)

## 2013-02-10 LAB — COMPREHENSIVE METABOLIC PANEL
AST: 62 U/L — ABNORMAL HIGH (ref 0–37)
Albumin: 3.8 g/dL (ref 3.5–5.2)
Calcium: 8.8 mg/dL (ref 8.4–10.5)
Creatinine, Ser: 0.71 mg/dL (ref 0.50–1.10)
GFR calc non Af Amer: >90 mL/min (ref >90)

## 2013-02-10 LAB — URINALYSIS, ROUTINE W REFLEX MICROSCOPIC
Bilirubin Urine: NEGATIVE
Nitrite: NEGATIVE
Protein, ur: NEGATIVE mg/dL
Specific Gravity, Urine: 1.025 (ref 1.005–1.030)
Urobilinogen, UA: 0.2 mg/dL (ref 0.0–1.0)

## 2013-02-10 LAB — HCG, SERUM, QUALITATIVE: Preg, Serum: NEGATIVE

## 2013-02-10 LAB — ETHANOL: Alcohol, Ethyl (B): 221 mg/dL — ABNORMAL HIGH (ref 0–11)

## 2013-02-10 LAB — LIPASE, BLOOD: Lipase: 24 U/L (ref 11–59)

## 2013-02-10 MED ORDER — ONDANSETRON HCL 4 MG PO TABS: 4.0000 mg | ORAL_TABLET | Freq: Four times a day (QID) | ORAL | Status: DC

## 2013-02-10 MED ORDER — MORPHINE SULFATE 4 MG/ML IJ SOLN
4.0000 mg | INTRAMUSCULAR | Status: DC | PRN
  Administered 2013-02-10: 4 mg via INTRAVENOUS
  Filled 2013-02-10: qty 1

## 2013-02-10 MED ORDER — SODIUM CHLORIDE 0.9 % IV BOLUS (SEPSIS)
1000.0000 mL | Freq: Once | INTRAVENOUS | Status: AC
  Administered 2013-02-10: 1000 mL via INTRAVENOUS

## 2013-02-10 MED ORDER — TRAMADOL HCL 50 MG PO TABS: 50.0000 mg | ORAL_TABLET | Freq: Four times a day (QID) | ORAL | Status: DC | PRN

## 2013-02-10 MED ORDER — ONDANSETRON HCL 4 MG/2ML IJ SOLN
4.0000 mg | Freq: Once | INTRAMUSCULAR | Status: AC
  Administered 2013-02-10: 4 mg via INTRAVENOUS
  Filled 2013-02-10: qty 2
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
## 2013-02-10 NOTE — ED Notes (Signed)
Pt to be here at 2:45pm for ultrasound tomorrow. NPO after 9am.

## 2013-02-10 NOTE — ED Provider Notes (Signed)
CSN: [id number]     Arrival date & time 02/10/13  2046 History  This chart was scribed for Claudean Kinds, MD by Dorothey Baseman, ED Scribe and Bennett Scrape, ED Scribe. This patient was seen in room [facility] and the patient's care was started at 9:49 PM.   First MD Initiated Contact with Patient 02/10/13 2149     Chief Complaint  Patient presents with  . Abdominal Pain  . Emesis    The history is provided by the patient. No language interpreter was used.   HPI Comments: [name] is a 32 y.o. female who presents to the Emergency Department complaining of severe, intermittent RLQ abdominal pain, episodes lasting 10-15 minutes, that radiates to her back onset 4-5 days ago. She reports green colored stools, nausea, arthralgias, myalgias, and chest tightness. She denies history of kidney stones. She denies cold, cough, sore throat, fever, vomiting, shortness of breath, urinary symptoms, and vaginal symptoms. She states that her boyfriend has Hepatitis C and that they do not use contraception. She admits to drinking 3-24oz. Beers and her speech is slightly slurred. She reports history of daily alcohol use with associated withdrawal symptoms.  Past Medical History  Diagnosis Date  . Seizures   . Bipolar 1 disorder   . Anxiety   . Hepatitis C    Past Surgical History  Procedure Laterality Date  . Tubal ligation     No family history on file. History  Substance Use Topics  . Smoking status: Current Every Day Smoker -- 0.50 packs/day    Types: Cigarettes  . Smokeless tobacco: Not on file  . Alcohol Use: 1.8 oz/week    3 Cans of beer per week     Comment: daily   OB History   Grav Para Term Preterm Abortions TAB SAB Ect Mult Living                 Review of Systems  Constitutional: Negative for fever, chills, diaphoresis, appetite change and fatigue.  HENT: Negative for sore throat, mouth sores and trouble swallowing.   Eyes: Negative for visual disturbance.  Respiratory:  Positive for chest tightness. Negative for cough, shortness of breath and wheezing.   Cardiovascular: Negative for chest pain.  Gastrointestinal: Positive for nausea and abdominal pain. Negative for vomiting, diarrhea and abdominal distention.  Endocrine: Negative for polydipsia, polyphagia and polyuria.  Genitourinary: Negative for dysuria, frequency, hematuria, vaginal bleeding, vaginal discharge and difficulty urinating.  Musculoskeletal: Positive for myalgias and arthralgias. Negative for gait problem.  Skin: Negative for color change, pallor and rash.  Neurological: Negative for dizziness, syncope, light-headedness and headaches.  Hematological: Does not bruise/bleed easily.  Psychiatric/Behavioral: Negative for behavioral problems and confusion.    Allergies  Review of patient's allergies indicates no known allergies.  Home Medications   Current Outpatient Rx  Name  Route  Sig  Dispense  Refill  . clonazePAM (KLONOPIN) 0.5 MG tablet   Oral   Take 0.5 mg by mouth 3 (three) times daily.         . ondansetron (ZOFRAN) 4 MG tablet   Oral   Take 1 tablet (4 mg total) by mouth every 6 (six) hours.   12 tablet   0   . traMADol (ULTRAM) 50 MG tablet   Oral   Take 1 tablet (50 mg total) by mouth every 6 (six) hours as needed for pain.   15 tablet   0    Triage Vitals: BP 114/83  Pulse 92  Temp(Src) 99 F (37.2 C) (Oral)  Resp 18  SpO2 94%  LMP 02/02/2013  Physical Exam  Nursing note and vitals reviewed. Constitutional: She is oriented to person, place, and time. She appears well-developed and well-nourished. No distress.  HENT:  Head: Normocephalic.  Mouth/Throat: Oropharynx is clear and moist.  Eyes: Conjunctivae are normal. Pupils are equal, round, and reactive to light. No scleral icterus.  Neck: Normal range of motion. Neck supple. No thyromegaly present.  Cardiovascular: Normal rate and regular rhythm.  Exam reveals no gallop and no friction rub.   No murmur  heard. Pulmonary/Chest: Effort normal and breath sounds normal. No respiratory distress. She has no wheezes. She has no rales.  Abdominal: Soft. Bowel sounds are normal. She exhibits no distension. There is tenderness. There is rebound and guarding.  RUQ tenderness upon palpation. Positive Murphy's sign. Involuntary guarding.   Musculoskeletal: Normal range of motion.  Neurological: She is alert and oriented to person, place, and time.  Skin: Skin is warm and dry. No rash noted.  Psychiatric: Her behavior is normal.  Anxious    ED Course   DIAGNOSTIC STUDIES: Oxygen Saturation is 94% on room air, adequate by my interpretation.    COORDINATION OF CARE: 9:56PM- Ordered blood test, IV fluids, pain medication. Discussed treatment plan with patient at bedside and patient agreed.    Procedures (including critical care time)  Labs Reviewed  COMPREHENSIVE METABOLIC PANEL - Abnormal; Notable for the following:    Glucose, Bld 105 (*)    AST 62 (*)    ALT 84 (*)    Total Bilirubin 0.2 (*)    All other components within normal limits  ETHANOL - Abnormal; Notable for the following:    Alcohol, Ethyl (B) 221 (*)    All other components within normal limits  CBC WITH DIFFERENTIAL  LIPASE, BLOOD  HCG, SERUM, QUALITATIVE  URINALYSIS, ROUTINE W REFLEX MICROSCOPIC   No results found. 1. Biliary colic   2. Abdominal pain   3. Alcohol abuse     MDM  Glucose 221. Her hepatobiliary and pancreatic enzymes are normal. Platelets of elevation. Splint and discharged home in good return tomorrow for outpatient ultrasound Ultram for pain Zofran for nausea and emesis,  Dx:   alcohol use and abuse and abdominal pain possible biliary colic without cholecystitis   I personally performed the services described in this documentation, which was scribed in my presence. The recorded information has been reviewed and is accurate.    Claudean Kinds, MD 02/10/13 2322

## 2013-02-11 ENCOUNTER — Ambulatory Visit (HOSPITAL_COMMUNITY)
Admit: 2013-02-11 | Discharge: 2013-02-11 | Disposition: A | Discharge status: Home / Self Care | Payer: Self-pay | Attending: Emergency Medicine | Admitting: Emergency Medicine

## 2013-02-11 DIAGNOSIS — R1011 Right upper quadrant pain: Secondary | ICD-10-CM | POA: Insufficient documentation

## 2013-02-11 DIAGNOSIS — R110 Nausea: Secondary | ICD-10-CM | POA: Insufficient documentation

## 2013-02-11 NOTE — ED Notes (Signed)
Pt advised she would have to go wait in the waiting room in 10 more minutes.

## 2013-10-10 ENCOUNTER — Encounter (HOSPITAL_COMMUNITY): Payer: Self-pay | Admitting: Emergency Medicine

## 2013-10-10 ENCOUNTER — Emergency Department (HOSPITAL_COMMUNITY)
Admission: EM | Admit: 2013-10-10 | Discharge: 2013-10-11 | Disposition: A | Discharge status: Home / Self Care | Payer: Self-pay | Attending: Emergency Medicine | Admitting: Emergency Medicine

## 2013-10-10 DIAGNOSIS — F411 Generalized anxiety disorder: Secondary | ICD-10-CM | POA: Insufficient documentation

## 2013-10-10 DIAGNOSIS — F10239 Alcohol dependence with withdrawal, unspecified: Secondary | ICD-10-CM

## 2013-10-10 DIAGNOSIS — R1031 Right lower quadrant pain: Secondary | ICD-10-CM | POA: Insufficient documentation

## 2013-10-10 DIAGNOSIS — F1910 Other psychoactive substance abuse, uncomplicated: Secondary | ICD-10-CM | POA: Diagnosis present

## 2013-10-10 DIAGNOSIS — F1210 Cannabis abuse, uncomplicated: Secondary | ICD-10-CM | POA: Insufficient documentation

## 2013-10-10 DIAGNOSIS — R1011 Right upper quadrant pain: Secondary | ICD-10-CM | POA: Insufficient documentation

## 2013-10-10 DIAGNOSIS — F1110 Opioid abuse, uncomplicated: Secondary | ICD-10-CM | POA: Insufficient documentation

## 2013-10-10 DIAGNOSIS — F17200 Nicotine dependence, unspecified, uncomplicated: Secondary | ICD-10-CM | POA: Insufficient documentation

## 2013-10-10 DIAGNOSIS — Z8719 Personal history of other diseases of the digestive system: Secondary | ICD-10-CM | POA: Insufficient documentation

## 2013-10-10 DIAGNOSIS — Z8619 Personal history of other infectious and parasitic diseases: Secondary | ICD-10-CM | POA: Insufficient documentation

## 2013-10-10 DIAGNOSIS — Z9851 Tubal ligation status: Secondary | ICD-10-CM | POA: Insufficient documentation

## 2013-10-10 DIAGNOSIS — R109 Unspecified abdominal pain: Secondary | ICD-10-CM

## 2013-10-10 DIAGNOSIS — Z3202 Encounter for pregnancy test, result negative: Secondary | ICD-10-CM | POA: Insufficient documentation

## 2013-10-10 DIAGNOSIS — Z79899 Other long term (current) drug therapy: Secondary | ICD-10-CM | POA: Insufficient documentation

## 2013-10-10 DIAGNOSIS — F319 Bipolar disorder, unspecified: Secondary | ICD-10-CM | POA: Insufficient documentation

## 2013-10-10 DIAGNOSIS — R1013 Epigastric pain: Secondary | ICD-10-CM | POA: Insufficient documentation

## 2013-10-10 DIAGNOSIS — F1010 Alcohol abuse, uncomplicated: Secondary | ICD-10-CM | POA: Insufficient documentation

## 2013-10-10 DIAGNOSIS — F10939 Alcohol use, unspecified with withdrawal, unspecified: Secondary | ICD-10-CM | POA: Diagnosis present

## 2013-10-10 DIAGNOSIS — Z8669 Personal history of other diseases of the nervous system and sense organs: Secondary | ICD-10-CM | POA: Insufficient documentation

## 2013-10-10 LAB — POC URINE PREG, ED: Preg Test, Ur: NEGATIVE

## 2013-10-10 LAB — CBC WITH DIFFERENTIAL/PLATELET
BASOS ABS: 0 10*3/uL (ref 0.0–0.1)
Basophils Relative: 0 % (ref 0–1)
EOS ABS: 0.1 10*3/uL (ref 0.0–0.7)
EOS PCT: 2 % (ref 0–5)
HEMATOCRIT: 39.8 % (ref 36.0–46.0)
Hemoglobin: 13.8 g/dL (ref 12.0–15.0)
Lymphocytes Relative: 32 % (ref 12–46)
Lymphs Abs: 2.5 10*3/uL (ref 0.7–4.0)
MCH: 34.2 pg — AB (ref 26.0–34.0)
MCHC: 34.7 g/dL (ref 30.0–36.0)
MCV: 98.5 fL (ref 78.0–100.0)
MONO ABS: 0.7 10*3/uL (ref 0.1–1.0)
Monocytes Relative: 10 % (ref 3–12)
Neutro Abs: 4.4 10*3/uL (ref 1.7–7.7)
Neutrophils Relative %: 56 % (ref 43–77)
Platelets: 200 10*3/uL (ref 150–400)
RBC: 4.04 MIL/uL (ref 3.87–5.11)
RDW: 12.7 % (ref 11.5–15.5)
WBC: 7.8 10*3/uL (ref 4.0–10.5)

## 2013-10-10 LAB — COMPREHENSIVE METABOLIC PANEL
ALBUMIN: 4.1 g/dL (ref 3.5–5.2)
ALT: 93 U/L — AB (ref 0–35)
AST: 71 U/L — ABNORMAL HIGH (ref 0–37)
Alkaline Phosphatase: 44 U/L (ref 39–117)
BUN: 14 mg/dL (ref 6–23)
CALCIUM: 9.2 mg/dL (ref 8.4–10.5)
CO2: 20 mEq/L (ref 19–32)
CREATININE: 0.73 mg/dL (ref 0.50–1.10)
Chloride: 103 mEq/L (ref 96–112)
GFR calc Af Amer: >90 mL/min (ref >90)
GFR calc non Af Amer: >90 mL/min (ref >90)
Glucose, Bld: 84 mg/dL (ref 70–99)
Potassium: 4.2 mEq/L (ref 3.7–5.3)
Sodium: 140 mEq/L (ref 137–147)
TOTAL PROTEIN: 7.8 g/dL (ref 6.0–8.3)
Total Bilirubin: 0.4 mg/dL (ref 0.3–1.2)

## 2013-10-10 LAB — URINALYSIS, ROUTINE W REFLEX MICROSCOPIC
Bilirubin Urine: NEGATIVE
Glucose, UA: NEGATIVE mg/dL
Hgb urine dipstick: NEGATIVE
KETONES UR: NEGATIVE mg/dL
Leukocytes, UA: NEGATIVE
NITRITE: NEGATIVE
PH: 5 (ref 5.0–8.0)
PROTEIN: NEGATIVE mg/dL
Specific Gravity, Urine: 1.009 (ref 1.005–1.030)
Urobilinogen, UA: 0.2 mg/dL (ref 0.0–1.0)

## 2013-10-10 LAB — SALICYLATE LEVEL

## 2013-10-10 LAB — ACETAMINOPHEN LEVEL: Acetaminophen (Tylenol), Serum: <15 ug/mL (ref 10–30)

## 2013-10-10 LAB — ETHANOL: Alcohol, Ethyl (B): 141 mg/dL — ABNORMAL HIGH (ref 0–11)

## 2013-10-10 LAB — RAPID URINE DRUG SCREEN, HOSP PERFORMED
AMPHETAMINES: NOT DETECTED
BENZODIAZEPINES: NOT DETECTED
Barbiturates: NOT DETECTED
COCAINE: NOT DETECTED
OPIATES: POSITIVE — AB
TETRAHYDROCANNABINOL: POSITIVE — AB

## 2013-10-10 LAB — LIPASE, BLOOD: Lipase: 25 U/L (ref 11–59)

## 2013-10-10 MED ORDER — ONDANSETRON HCL 4 MG PO TABS: 4.0000 mg | ORAL_TABLET | Freq: Three times a day (TID) | ORAL | Status: DC | PRN

## 2013-10-10 MED ORDER — NICOTINE 21 MG/24HR TD PT24
21.0000 mg | MEDICATED_PATCH | Freq: Every day | TRANSDERMAL | Status: DC
  Administered 2013-10-11: 21 mg via TRANSDERMAL
  Filled 2013-10-10: qty 1

## 2013-10-10 MED ORDER — ALUM & MAG HYDROXIDE-SIMETH 200-200-20 MG/5ML PO SUSP: 30.0000 mL | ORAL | Status: DC | PRN

## 2013-10-10 MED ORDER — THIAMINE HCL 100 MG/ML IJ SOLN: 100.0000 mg | Freq: Every day | INTRAMUSCULAR | Status: DC

## 2013-10-10 MED ORDER — LORAZEPAM 1 MG PO TABS: 0.0000 mg | ORAL_TABLET | Freq: Two times a day (BID) | ORAL | Status: DC

## 2013-10-10 MED ORDER — LORAZEPAM 1 MG PO TABS
0.0000 mg | ORAL_TABLET | Freq: Four times a day (QID) | ORAL | Status: DC
  Administered 2013-10-11 (×2): 1 mg via ORAL
  Filled 2013-10-10 (×2): qty 1

## 2013-10-10 MED ORDER — VITAMIN B-1 100 MG PO TABS
100.0000 mg | ORAL_TABLET | Freq: Every day | ORAL | Status: DC
  Administered 2013-10-11: 100 mg via ORAL
  Filled 2013-10-10: qty 1

## 2013-10-10 MED ORDER — IBUPROFEN 200 MG PO TABS
600.0000 mg | ORAL_TABLET | Freq: Three times a day (TID) | ORAL | Status: DC | PRN
Start: 2013-10-10 — End: 2013-10-11
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
## 2013-10-10 NOTE — ED Provider Notes (Signed)
CSN: [id number]     Arrival date & time 10/10/13  2004 History   First MD Initiated Contact with Patient 10/10/13 2116     Chief Complaint  Patient presents with  . Medical Clearance     (Consider location/radiation/quality/duration/timing/severity/associated sxs/prior Treatment) Patient is a 33 y.o. female presenting with mental health disorder.  Mental Health Problem Presenting symptoms comment:  Alcohol abuse Degree of incapacity (severity):  Severe Onset quality:  Gradual Duration: Chronic. Timing:  Constant Progression:  Worsening Chronicity:  Chronic Context: alcohol use and drug abuse   Relieved by:  Nothing Worsened by:  Nothing tried Associated symptoms: abdominal pain   Associated symptoms: no chest pain     Past Medical History  Diagnosis Date  . Seizures   . Bipolar 1 disorder   . Anxiety   . Hepatitis C    Past Surgical History  Procedure Laterality Date  . Tubal ligation     History reviewed. No pertinent family history. History  Substance Use Topics  . Smoking status: Current Every Day Smoker -- 0.50 packs/day    Types: Cigarettes  . Smokeless tobacco: Not on file  . Alcohol Use: 1.8 oz/week    3 Cans of beer per week     Comment: daily   OB History   Grav Para Term Preterm Abortions TAB SAB Ect Mult Living                 Review of Systems  Constitutional: Negative for fever.  HENT: Negative for congestion.   Respiratory: Negative for cough and shortness of breath.   Cardiovascular: Negative for chest pain.  Gastrointestinal: Positive for nausea and abdominal pain. Negative for diarrhea.  All other systems reviewed and are negative.     Allergies  Review of patient's allergies indicates no known allergies.  Home Medications   Prior to Admission medications   Medication Sig Start Date End Date Taking? Authorizing Provider  amoxicillin (AMOXIL) 500 MG capsule Take 500 mg by mouth 3 (three) times daily. For 7 days. For dental  infection. 10/09/13  Yes Historical Provider, MD  HYDROcodone-acetaminophen (NORCO/VICODIN) 5-325 MG per tablet Take 1 tablet by mouth every 6 (six) hours as needed for moderate pain.   Yes Historical Provider, MD   BP 137/84  Pulse 98  Temp(Src) 98.3 F (36.8 C) (Oral)  Resp 18  SpO2 100%  LMP 10/08/2013 Physical Exam  Nursing note and vitals reviewed. Constitutional: She is oriented to person, place, and time. She appears well-developed and well-nourished. No distress.  HENT:  Head: Normocephalic and atraumatic.  Mouth/Throat: Oropharynx is clear and moist.  Poor dentition  Eyes: Conjunctivae are normal. Pupils are equal, round, and reactive to light. No scleral icterus.  Neck: Neck supple.  Cardiovascular: Normal rate, regular rhythm, normal heart sounds and intact distal pulses.   No murmur heard. Pulmonary/Chest: Effort normal and breath sounds normal. No stridor. No respiratory distress. She has no rales.  Abdominal: Soft. Bowel sounds are normal. She exhibits no distension. There is tenderness (mild) in the right upper quadrant, right lower quadrant and epigastric area. There is no rigidity, no rebound and no guarding.  Musculoskeletal: Normal range of motion.  Neurological: She is alert and oriented to person, place, and time.  Skin: Skin is warm and dry. No rash noted.  Psychiatric: She has a normal mood and affect. Her behavior is normal.    ED Course  Procedures (including critical care time) Labs Review Labs Reviewed  COMPREHENSIVE METABOLIC PANEL -  Abnormal; Notable for the following:    AST 71 (*)    ALT 93 (*)    All other components within normal limits  ETHANOL - Abnormal; Notable for the following:    Alcohol, Ethyl (B) 141 (*)    All other components within normal limits  SALICYLATE LEVEL - Abnormal; Notable for the following:    Salicylate Lvl <2.0 (*)    All other components within normal limits  URINE RAPID DRUG SCREEN (HOSP PERFORMED) - Abnormal;  Notable for the following:    Opiates POSITIVE (*)    Tetrahydrocannabinol POSITIVE (*)    All other components within normal limits  CBC WITH DIFFERENTIAL - Abnormal; Notable for the following:    MCH 34.2 (*)    All other components within normal limits  ACETAMINOPHEN LEVEL  LIPASE, BLOOD  URINALYSIS, ROUTINE W REFLEX MICROSCOPIC  POC URINE PREG, ED    Imaging Review No results found.   EKG Interpretation None      MDM   Final diagnoses:  Alcohol abuse  Abdominal pain    33 year old female presenting with chief complaint of alcohol abuse requesting detox.  Reports DTs with prior detox attempts. She also reports 3 weeks of right-sided abdominal pain.  Abd exam is benign. Given time course, exam, and labs, have very low suspicion for cholecystitis, pancreatitis, appendicitis, ovarian torsion.  Repeat abdominal exam also benign.  Plan TTS consult for detox.      Candyce ChurnJohn David Razia Screws III, MD 10/11/13 [phone]

## 2013-10-10 NOTE — ED Notes (Signed)
Pt states she is an alcoholic and wants detox from alcohol  Pt states last drank about an hour ago  Pt states drinks daily  Pt states she has had seizures from trying to detox herself  Pt states she has water weight gain, fatigue, shortness of breath

## 2013-10-10 NOTE — BH Assessment (Addendum)
PT to be assessed at 2300

## 2013-10-11 ENCOUNTER — Encounter (HOSPITAL_COMMUNITY): Payer: Self-pay | Admitting: Registered Nurse

## 2013-10-11 DIAGNOSIS — F1994 Other psychoactive substance use, unspecified with psychoactive substance-induced mood disorder: Secondary | ICD-10-CM

## 2013-10-11 DIAGNOSIS — F1010 Alcohol abuse, uncomplicated: Secondary | ICD-10-CM

## 2013-10-11 DIAGNOSIS — F1910 Other psychoactive substance abuse, uncomplicated: Secondary | ICD-10-CM | POA: Diagnosis present

## 2013-10-11 DIAGNOSIS — F10939 Alcohol use, unspecified with withdrawal, unspecified: Secondary | ICD-10-CM | POA: Diagnosis present

## 2013-10-11 DIAGNOSIS — F10239 Alcohol dependence with withdrawal, unspecified: Secondary | ICD-10-CM | POA: Diagnosis present
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
## 2013-10-11 NOTE — ED Notes (Signed)
Pt given blue scrubs and instructed to change and to place all belongings into pt belonging bag.  
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
## 2013-10-11 NOTE — BH Assessment (Signed)
[facility] Assessment Progress Note     No answer at [facility].  [facility]

## 2013-10-11 NOTE — ED Notes (Signed)
AA schedule and [place]  Resources given to patient.

## 2013-10-11 NOTE — Consult Note (Signed)
Face to face evaluation and I agree with this note 

## 2013-10-11 NOTE — BH Assessment (Signed)
Patient requesting to leave. [facility] longer [facility]. Writer will notify Assunta FoundShuvon Rankin, NP and Dr. Ladona Ridgelaylor asap and request that patient's discharge in put in so that she may leave [facility]. Writer has provided patient with a list of AA support groups times/dates. Additionally, [facility].

## 2013-10-11 NOTE — Discharge Instructions (Signed)
Alcohol and Nutrition °Nutrition serves two purposes. It provides energy. It also maintains body structure and function. Food supplies energy. It also provides the building blocks needed to replace worn or damaged cells. Alcoholics often eat poorly. This limits their supply of essential nutrients. This affects energy supply and structure maintenance. Alcohol also affects the body's nutrients in: °· Digestion. °· Storage. °· Using and getting rid of waste products. °IMPAIRMENT OF NUTRIENT DIGESTION AND UTILIZATION  °· Once ingested, food must be broken down into small components (digested). Then it is available for energy. It helps maintain body structure and function. Digestion begins in the mouth. It continues in the stomach and intestines, with help from the pancreas. The nutrients from digested food are absorbed from the intestines into the blood. Then they are carried to the liver. The liver prepares nutrients for: °· Immediate use. °· Storage and future use. °· Alcohol inhibits the breakdown of nutrients into usable molecules. °· It decreases secretion of digestive enzymes from the pancreas. °· Alcohol impairs nutrient absorption by damaging the cells lining the stomach and intestines. °· It also interferes with moving some nutrients into the blood. °· In addition, nutritional deficiencies themselves may lead to further absorption problems. °· For example, folate deficiency changes the cells that line the small intestine. This impairs how water is absorbed. It also affects absorbed nutrients. These include glucose, sodium, and additional folate. °· Even if nutrients are digested and absorbed, alcohol can prevent them from being fully used. It changes their transport, storage, and excretion. Impaired utilization of nutrients by alcoholics is indicated by: °· Decreased liver stores of vitamins, such as vitamin A. °· Increased excretion of nutrients such as fat. °ALCOHOL AND ENERGY SUPPLY  °· Three basic  nutritional components found in food are: °· Carbohydrates. °· Proteins. °· Fats. °· These are used as energy. Some alcoholics take in as much as 50% of their total daily calories from alcohol. They often neglect important foods. °· Even when enough food is eaten, alcohol can impair the ways the body controls blood sugar (glucose) levels. It may either increase or decrease blood sugar. °· In non-diabetic alcoholics, increased blood sugar (hyperglycemia) is caused by poor insulin secretion. It is usually temporary. °· Decreased blood sugar (hypoglycemia) can cause serious injury even if this condition is short-lived. Low blood sugar can happen when a fasting or malnourished person drinks alcohol. When there is no food to supply energy, stored sugar is used up. The products of alcohol inhibit forming glucose from other compounds such as amino acids. As a result, alcohol causes the brain and other body tissue to lack glucose. It is needed for energy and function. °· Alcohol is an energy source. But how the body processes and uses the energy from alcohol is complex. Also, when alcohol is substituted for carbohydrates, subjects tend to lose weight. This indicates that they get less energy from alcohol than from food. °ALCOHOL - MAINTAINING CELL STRUCTURE AND FUNCTION  °Structure °Cells are made mostly of protein. So an adequate protein diet is important for maintaining cell structure. This is especially true if cells are being damaged. Research indicates that alcohol affects protein nutrition by causing impaired: °· Digestion of proteins to amino acids. °· Processing of amino acids by the small intestine and liver. °· Synthesis of proteins from amino acids. °· Protein secretion by the liver. °Function °Nutrients are essential for the body to function well. They provide the tools that the body needs to work well:  °·   Proteins.  Vitamins.  Minerals. Alcohol can disrupt body function. It may cause nutrient  deficiencies. And it may interfere with the way nutrients are processed. Vitamins  Vitamins are essential to maintain growth and normal metabolism. They regulate many of the body`s processes. Chronic heavy drinking causes deficiencies in many vitamins. This is caused by eating less. And, in some cases, vitamins may be poorly absorbed. For example, alcohol inhibits fat absorption. It impairs how the vitamins A, E, and D are normally absorbed along with dietary fats. Not enough vitamin A may cause night blindness. Not enough vitamin D may cause softening of the bones.  Some alcoholics lack vitamins A, C, D, E, K, and the B vitamins. These are all involved in wound healing and cell maintenance. In particular, because vitamin K is necessary for blood clotting, lacking that vitamin can cause delayed clotting. The result is excess bleeding. Lacking other vitamins involved in brain function may cause severe neurological damage. Minerals Deficiencies of minerals such as calcium, magnesium, iron, and zinc are common in alcoholics. The alcohol itself does not seem to affect how these minerals are absorbed. Rather, they seem to occur secondary to other alcohol-related problems, such as:  Less calcium absorbed.  Not enough magnesium.  More urinary excretion.  Vomiting.  Diarrhea.  Not enough iron due to gastrointestinal bleeding.  Not enough zinc or losses related to other nutrient deficiencies.  Mineral deficiencies can cause a variety of medical consequences. These range from calcium-related bone disease to zinc-related night blindness and skin lesions. ALCOHOL, MALNUTRITION, AND MEDICAL COMPLICATIONS  Liver Disease   Alcoholic liver damage is caused primarily by alcohol itself. But poor nutrition may increase the risk of alcohol-related liver damage. For example, nutrients normally found in the liver are known to be affected by drinking alcohol. These include carotenoids, which are the major  sources of vitamin A, and vitamin E compounds. Decreases in such nutrients may play some role in alcohol-related liver damage. Pancreatitis  Research suggests that malnutrition may increase the risk of developing alcoholic pancreatitis. Research suggests that a diet lacking in protein may increase alcohol's damaging effect on the pancreas. Brain  Nutritional deficiencies may have severe effects on brain function. These may be permanent. Specifically, thiamine deficiencies are often seen in alcoholics. They can cause severe neurological problems. These include:  Impaired movement.  Memory loss seen in Wernicke-Korsakoff syndrome. Pregnancy  Alcohol has toxic effects on fetal development. It causes alcohol-related birth defects. They include fetal alcohol syndrome. Alcohol itself is toxic to the fetus. Also, the nutritional deficiency can affect how the fetus develops. That may compound the risk of developmental damage.  Nutritional needs during pregnancy are 10% to 30% greater than normal. Food intake can increase by as much as 140% to cover the needs of both mother and fetus. An alcoholic mother`s nutritional problems may adversely affect the nutrition of the fetus. And alcohol itself can also restrict nutrition flow to the fetus. NUTRITIONAL STATUS OF ALCOHOLICS  Techniques for assessing nutritional status include:  Taking body measurements to estimate fat reserves. They include:  Weight.  Height.  Mass.  Skin fold thickness.  Performing blood analysis to provide measurements of circulating:  Proteins.  Vitamins.  Minerals.  These techniques tend to be imprecise. For many nutrients, there is no clear "cut-off" point that would allow an accurate definition of deficiency. So assessing the nutritional status of alcoholics is limited by these techniques. Dietary status may provide information about the risk of developing nutritional problems.  Dietary status is assessed by:  Taking  patients' dietary histories.  Evaluating the amount and types of food they are eating.  It is difficult to determine what exact amount of alcohol begins to have damaging effects on nutrition. In general, moderate drinkers have 2 drinks or less per day. They seem to be at little risk for nutritional problems. Various medical disorders begin to appear at greater levels.  Research indicates that the majority of even the heaviest drinkers have few obvious nutritional deficiencies. Many alcoholics who are hospitalized for medical complications of their disease do have severe malnutrition. Alcoholics tend to eat poorly. Often they eat less than the amounts of food necessary to provide enough:  Carbohydrates.  Protein.  Fat.  Vitamins A and C.  B vitamins.  Minerals like calcium and iron. Of major concern is alcohol's effect on digesting food and use of nutrients. It may shift a mildly malnourished person toward severe malnutrition. Document Released: 04/03/2005 Document Revised: 09/01/2011 Document Reviewed: 09/17/2005 [facility] Patient Information 2014 [place].  Alcohol Use Disorder Alcohol use disorder is a mental disorder. It is not a one-time incident of heavy drinking. Alcohol use disorder is the excessive and uncontrollable use of alcohol over time that leads to problems with functioning in one or more areas of daily living. People with this disorder risk harming themselves and others when they drink to excess. Alcohol use disorder also can cause other mental disorders, such as mood and anxiety disorders, and serious physical problems. People with alcohol use disorder often misuse other drugs.  Alcohol use disorder is common and widespread. Some people with this disorder drink alcohol to cope with or escape from negative life events. Others drink to relieve chronic pain or symptoms of mental illness. People with a family history of alcohol use disorder are at higher risk of losing  control and using alcohol to excess.  SYMPTOMS  Signs and symptoms of alcohol use disorder may include the following:   Consumption ofalcohol inlarger amounts or over a longer period of time than intended.  Multiple unsuccessful attempts to cutdown or control alcohol use.   A great deal of time spent obtaining alcohol, using alcohol, or recovering from the effects of alcohol (hangover).  A strong desire or urge to use alcohol (cravings).   Continued use of alcohol despite problems at work, school, or home because of alcohol use.   Continued use of alcohol despite problems in relationships because of alcohol use.  Continued use of alcohol in situations when it is physically hazardous, such as driving a car.  Continued use of alcohol despite awareness of a physical or psychological problem that is likely related to alcohol use. Physical problems related to alcohol use can involve the brain, heart, liver, stomach, and intestines. Psychological problems related to alcohol use include intoxication, depression, anxiety, psychosis, delirium, and dementia.   The need for increased amounts of alcohol to achieve the same desired effect, or a decreased effect from the consumption of the same amount of alcohol (tolerance).  Withdrawal symptoms upon reducing or stopping alcohol use, or alcohol use to reduce or avoid withdrawal symptoms. Withdrawal symptoms include:  Racing heart.  Hand tremor.  Difficulty sleeping.  Nausea.  Vomiting.  Hallucinations.  Restlessness.  Seizures. DIAGNOSIS Alcohol use disorder is diagnosed through an assessment by your caregiver. Your caregiver may start by asking three or four questions to screen for excessive or problematic alcohol use. To confirm a diagnosis of alcohol use disorder, at least two symptoms (  see SYMPTOMS) must be present within a 676-month period. The severity of alcohol use disorder depends on the number of symptoms:  Mild two or  three.  Moderate four or five.  Severe six or more. Your caregiver may perform a physical exam or use results from lab tests to see if you have physical problems resulting from alcohol use. Your caregiver may refer you to a mental health professional for evaluation. TREATMENT  Some people with alcohol use disorder are able to reduce their alcohol use to low-risk levels. Some people with alcohol use disorder need to quit drinking alcohol. When necessary, mental health professionals with specialized training in substance use treatment can help. Your caregiver can help you decide how severe your alcohol use disorder is and what type of treatment you need. The following forms of treatment are available:   Detoxification. Detoxification involves the use of prescription medication to prevent alcohol withdrawal symptoms in the first week after quitting. This is important for people with a history of symptoms of withdrawal and for heavy drinkers who are likely to have withdrawal symptoms. Alcohol withdrawal can be dangerous and, in severe cases, cause death. Detoxification is usually provided in a hospital or in-patient substance use treatment facility.  Counseling or talk therapy. Talk therapy is provided by substance use treatment counselors. It addresses the reasons people use alcohol and ways to keep them from drinking again. The goals of talk therapy are to help people with alcohol use disorder find healthy activities and ways to cope with life stress, to identify and avoid triggers for alcohol use, and to handle cravings, which can cause relapse.  Medication.Different medications can help treat alcohol use disorder through the following actions:  Decrease alcohol cravings.  Decrease the positive reward response felt from alcohol use.  Produce an uncomfortable physical reaction when alcohol is used (aversion therapy).  Support groups. Support groups are run by people who have quit drinking. They  provide emotional support, advice, and guidance. These forms of treatment are often combined. Some people with alcohol use disorder benefit from intensive combination treatment provided by specialized substance use treatment centers. Both inpatient and outpatient treatment programs are available. Document Released: 07/17/2004 Document Revised: 02/09/2013 Document Reviewed: 09/16/2012 [facility] Patient Information 2014 [place], [place].  Alcohol Withdrawal Alcohol withdrawal happens when you normally drink alcohol a lot and suddenly stop drinking. Alcohol withdrawal symptoms can be mild to very bad. Mild withdrawal symptoms can include feeling sick to your stomach (nauseous), headache, or feeling irritable. Bad withdrawal symptoms can include shakiness, being very nervous (anxious), and not thinking clearly.  HOME CARE  Join an alcohol support group.  Stay away from people or situations that make you want to drink.  Eat a healthy diet. Eat a lot of fresh fruits, vegetables, and lean meats. GET HELP RIGHT AWAY IF:   You become confused. You start to see and hear things that are not really there.  You feel your heart beating very fast.  You throw up (vomit) blood or cannot stop throwing up. This may be bright red or look like black coffee grounds.  You have blood in your poop (stool). This may be bright red, maroon colored, or black and tarry.  You are lightheaded or pass out (faint).  You develop a fever. MAKE SURE YOU:   Understand these instructions.  Will watch your condition.  Will get help right away if you are not doing well or get worse. Document Released: 11/26/2007 Document Revised: 09/01/2011 Document Reviewed: 11/26/2007  ExitCare Patient Information 2014 [place], [place].  Chemical Dependency Chemical dependency is an addiction to drugs or alcohol. It is characterized by the repeated behavior of seeking out and using drugs and alcohol despite harmful consequences to the  health and safety of ones self and others.  RISK FACTORS There are certain situations or behaviors that increase a person's risk for chemical dependency. These include:  A family history of chemical dependency.  A history of mental health issues, including depression and anxiety.  A home environment where drugs and alcohol are easily available to you.  Drug or alcohol use at a young age. SYMPTOMS  The following symptoms can indicate chemical dependency:  Inability to limit the use of drugs or alcohol.  Nausea, sweating, shakiness, and anxiety that occurs when alcohol or drugs are not being used.  An increase in amount of drugs or alcohol that is necessary to get drunk or high. People who experience these symptoms can assess their use of drugs and alcohol by asking themselves the following questions:  Have you been told by friends or family that they are worried about your use of alcohol or drugs?  Do friends and family ever tell you about things you did while drinking alcohol or using drugs that you do not remember?  Do you lie about using alcohol or drugs or about the amounts you use?  Do you have difficulty completing daily tasks unless you use alcohol or drugs?  Is the level of your work or school performance lower because of your drug or alcohol use?  Do you get sick from using drugs or alcohol but keep using anyway?  Do you feel uncomfortable in social situations unless you use alcohol or drugs?  Do you use drugs or alcohol to help forget problems? An answer of yes to any of these questions may indicate chemical dependency. Professional evaluation is suggested. Document Released: 06/03/2001 Document Revised: 09/01/2011 Document Reviewed: 08/15/2010 [facility] Patient Information 2014 [place], [place].

## 2013-12-04 ENCOUNTER — Emergency Department (HOSPITAL_COMMUNITY): Payer: Self-pay

## 2013-12-04 ENCOUNTER — Encounter (HOSPITAL_COMMUNITY): Payer: Self-pay | Admitting: Emergency Medicine

## 2013-12-04 ENCOUNTER — Emergency Department (HOSPITAL_COMMUNITY)
Admission: EM | Admit: 2013-12-04 | Discharge: 2013-12-04 | Disposition: A | Discharge status: Home / Self Care | Payer: Self-pay | Attending: Emergency Medicine | Admitting: Emergency Medicine

## 2013-12-04 DIAGNOSIS — F1910 Other psychoactive substance abuse, uncomplicated: Secondary | ICD-10-CM

## 2013-12-04 DIAGNOSIS — Z8659 Personal history of other mental and behavioral disorders: Secondary | ICD-10-CM | POA: Insufficient documentation

## 2013-12-04 DIAGNOSIS — Z8619 Personal history of other infectious and parasitic diseases: Secondary | ICD-10-CM | POA: Insufficient documentation

## 2013-12-04 DIAGNOSIS — Z792 Long term (current) use of antibiotics: Secondary | ICD-10-CM | POA: Insufficient documentation

## 2013-12-04 DIAGNOSIS — F17200 Nicotine dependence, unspecified, uncomplicated: Secondary | ICD-10-CM | POA: Insufficient documentation

## 2013-12-04 DIAGNOSIS — F1010 Alcohol abuse, uncomplicated: Secondary | ICD-10-CM | POA: Insufficient documentation

## 2013-12-04 DIAGNOSIS — F1210 Cannabis abuse, uncomplicated: Secondary | ICD-10-CM | POA: Insufficient documentation

## 2013-12-04 LAB — CBC WITH DIFFERENTIAL/PLATELET
BASOS ABS: 0.1 10*3/uL (ref 0.0–0.1)
BASOS PCT: 1 % (ref 0–1)
EOS ABS: 0.2 10*3/uL (ref 0.0–0.7)
Eosinophils Relative: 2 % (ref 0–5)
HCT: 46.7 % — ABNORMAL HIGH (ref 36.0–46.0)
Hemoglobin: 16.6 g/dL — ABNORMAL HIGH (ref 12.0–15.0)
Lymphocytes Relative: 42 % (ref 12–46)
Lymphs Abs: 3.4 10*3/uL (ref 0.7–4.0)
MCH: 34.9 pg — AB (ref 26.0–34.0)
MCHC: 35.5 g/dL (ref 30.0–36.0)
MCV: 98.1 fL (ref 78.0–100.0)
Monocytes Absolute: 0.7 10*3/uL (ref 0.1–1.0)
Monocytes Relative: 9 % (ref 3–12)
NEUTROS PCT: 46 % (ref 43–77)
Neutro Abs: 3.8 10*3/uL (ref 1.7–7.7)
Platelets: 260 10*3/uL (ref 150–400)
RBC: 4.76 MIL/uL (ref 3.87–5.11)
RDW: 13.3 % (ref 11.5–15.5)
WBC: 8.1 10*3/uL (ref 4.0–10.5)

## 2013-12-04 LAB — COMPREHENSIVE METABOLIC PANEL
ALBUMIN: 3.8 g/dL (ref 3.5–5.2)
ALT: 118 U/L — AB (ref 0–35)
AST: 112 U/L — ABNORMAL HIGH (ref 0–37)
Alkaline Phosphatase: 61 U/L (ref 39–117)
BUN: 6 mg/dL (ref 6–23)
CALCIUM: 8.7 mg/dL (ref 8.4–10.5)
CO2: 25 mEq/L (ref 19–32)
Chloride: 107 mEq/L (ref 96–112)
Creatinine, Ser: 0.68 mg/dL (ref 0.50–1.10)
GFR calc Af Amer: >90 mL/min (ref >90)
GFR calc non Af Amer: >90 mL/min (ref >90)
Glucose, Bld: 86 mg/dL (ref 70–99)
POTASSIUM: 3.9 meq/L (ref 3.7–5.3)
SODIUM: 148 meq/L — AB (ref 137–147)
TOTAL PROTEIN: 7.4 g/dL (ref 6.0–8.3)
Total Bilirubin: 0.4 mg/dL (ref 0.3–1.2)

## 2013-12-04 LAB — RAPID URINE DRUG SCREEN, HOSP PERFORMED
AMPHETAMINES: NOT DETECTED
BENZODIAZEPINES: NOT DETECTED
Barbiturates: NOT DETECTED
COCAINE: NOT DETECTED
Opiates: NOT DETECTED
Tetrahydrocannabinol: POSITIVE — AB

## 2013-12-04 LAB — ETHANOL: Alcohol, Ethyl (B): 287 mg/dL — ABNORMAL HIGH (ref 0–11)

## 2013-12-04 MED ORDER — LORAZEPAM 2 MG/ML IJ SOLN
1.0000 mg | Freq: Once | INTRAMUSCULAR | Status: AC
  Administered 2013-12-04: 1 mg via INTRAVENOUS
  Filled 2013-12-04: qty 1

## 2013-12-04 MED ORDER — SODIUM CHLORIDE 0.9 % IV BOLUS (SEPSIS)
1000.0000 mL | Freq: Once | INTRAVENOUS | Status: AC
  Administered 2013-12-04: 1000 mL via INTRAVENOUS

## 2013-12-04 NOTE — BH Assessment (Addendum)
Called to speak with [name], NP about Pt, she is delayed due to TP. Will call back.  Clista BernhardtNancy Hezzie Karim, [facility] Triage Specialist 12/04/2013 8:27 PM
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
## 2013-12-04 NOTE — BH Assessment (Addendum)
Spoke with [name], NP she agrees Pt meets criteria for Inpatient but [facility] due to presenting problem seizures. TTS will seek placement elsewhere if EDP agrees.   Spoke with Dr. Adriana Simasook regarding recommendations. He again reports he is not sure Pt is having seizures at this time. His in agreement with In patient tx and for patient to be referred out.  Spoke with Kennith Centerracey, RN to inform Pt meets criteria and we will be seeking placement for her. Asked her to inform Pt.   Clista BernhardtNancy Saniya Tranchina, [facility] Triage Specialist 12/04/2013 9:09 PM

## 2013-12-04 NOTE — ED Notes (Signed)
Fathers Number Allena EaringJohn Masek [phone] and step mother Landry DykeVerna Kruse [phone]

## 2013-12-04 NOTE — ED Notes (Signed)
Pt with eyelid swelling (she has been crying) and swelling of upper lip (recent dental extractions) is here for reports of seizures (which she was awake for and she denies being due to etoh withdrawal) and twitching of face and neck.  Pt is upset about where she is now and her choices with etoh and drugs, conversation with pt and family (father and step mother who are at the bedside), pt reports generalized body aches which she attributes to her seizures.

## 2013-12-04 NOTE — BH Assessment (Signed)
Spoke with Dr. Adriana Simasook, he reports if Pt wants to leave he sees no reason to keep her. He asked that this Clinical research associatewriter fax over additional referral resources for Pt. This Clinical research associatewriter will fax referrals to include [facility] as she has expressed interest in resuming services there.   [name] BernhardtNancy Netra Postlethwait, [facility] Triage Specialist 12/04/2013 9:46 PM
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
## 2013-12-04 NOTE — BH Assessment (Signed)
[facility]. Called to review this with the nurse. Nurse unavailable, Angeline will pass on message and ask Nurse to review referrals with Pt.  Dear [name],  Thank you for allowing me to assess you today. Based on our discussion you could benefit from services to help you deal with Alcohol use, and to manage your symptoms of Bipolar Disorder. If you agree we can assist you in seeking in-patient services, as this is the level of care recommended at this time by your treatment team. However, if you do not feel agreeable to this I am providing additional resources for you. Please inform your nurse of your decision regarding treatment. If you would like to have out-patient services please contact an agency to set up an intake appointment before you leave the hospital.   [facility] ([facility] Health, [facility], [facility])  [address] [place], [place] [address] [phone] after hours and weekend number  [facility] 24/7 Customer Service Line (help accessing services) [phone]  [facility] you have expressed feeling the need to get help and a desire to improve your quality of life. Please take the first steps today by considering in-patient treatment, or calling to set up an appointment for services.   Very Best Regards,  Clista BernhardtNancy Zalika Tieszen, [facility] Triage Specialist
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
## 2013-12-04 NOTE — Discharge Instructions (Signed)
X-rays of head, neck, ankle all negative. Followup with your normal mental health therapist

## 2013-12-04 NOTE — ED Notes (Signed)
Patient reports having multiple seizures since Friday. Patient reports being unable to "hold neck straight up." Patient has slurred speech and reports that she was told that she had extremely high blood pressure a week ago [facility].

## 2014-08-17 ENCOUNTER — Encounter (HOSPITAL_COMMUNITY): Payer: Self-pay | Admitting: Emergency Medicine

## 2014-08-17 ENCOUNTER — Emergency Department (HOSPITAL_COMMUNITY)
Admission: EM | Admit: 2014-08-17 | Discharge: 2014-08-18 | Disposition: A | Discharge status: Home / Self Care | Payer: Self-pay | Attending: Emergency Medicine | Admitting: Emergency Medicine

## 2014-08-17 ENCOUNTER — Emergency Department (HOSPITAL_COMMUNITY): Payer: Self-pay

## 2014-08-17 DIAGNOSIS — R05 Cough: Secondary | ICD-10-CM | POA: Insufficient documentation

## 2014-08-17 DIAGNOSIS — R110 Nausea: Secondary | ICD-10-CM | POA: Insufficient documentation

## 2014-08-17 DIAGNOSIS — Z720 Tobacco use: Secondary | ICD-10-CM | POA: Insufficient documentation

## 2014-08-17 DIAGNOSIS — Z8619 Personal history of other infectious and parasitic diseases: Secondary | ICD-10-CM | POA: Insufficient documentation

## 2014-08-17 DIAGNOSIS — F419 Anxiety disorder, unspecified: Secondary | ICD-10-CM | POA: Insufficient documentation

## 2014-08-17 DIAGNOSIS — R059 Cough, unspecified: Secondary | ICD-10-CM

## 2014-08-17 DIAGNOSIS — R0789 Other chest pain: Secondary | ICD-10-CM | POA: Insufficient documentation

## 2014-08-17 DIAGNOSIS — H6692 Otitis media, unspecified, left ear: Secondary | ICD-10-CM | POA: Insufficient documentation

## 2014-08-17 LAB — CBC WITH DIFFERENTIAL/PLATELET
Basophils Absolute: 0 10*3/uL (ref 0.0–0.1)
Basophils Relative: 0 % (ref 0–1)
Eosinophils Absolute: 0.1 10*3/uL (ref 0.0–0.7)
Eosinophils Relative: 3 % (ref 0–5)
HCT: 40.6 % (ref 36.0–46.0)
Hemoglobin: 13.8 g/dL (ref 12.0–15.0)
Lymphocytes Relative: 33 % (ref 12–46)
Lymphs Abs: 1.7 10*3/uL (ref 0.7–4.0)
MCH: 34.5 pg — ABNORMAL HIGH (ref 26.0–34.0)
MCHC: 34 g/dL (ref 30.0–36.0)
MCV: 101.5 fL — ABNORMAL HIGH (ref 78.0–100.0)
Monocytes Absolute: 0.6 10*3/uL (ref 0.1–1.0)
Monocytes Relative: 12 % (ref 3–12)
Neutro Abs: 2.7 10*3/uL (ref 1.7–7.7)
Neutrophils Relative %: 52 % (ref 43–77)
Platelets: 189 10*3/uL (ref 150–400)
RBC: 4 MIL/uL (ref 3.87–5.11)
RDW: 12.7 % (ref 11.5–15.5)
WBC: 5.1 10*3/uL (ref 4.0–10.5)

## 2014-08-17 LAB — COMPREHENSIVE METABOLIC PANEL
ALT: 145 U/L — ABNORMAL HIGH (ref 0–35)
AST: 113 U/L — ABNORMAL HIGH (ref 0–37)
Albumin: 4.2 g/dL (ref 3.5–5.2)
Alkaline Phosphatase: 46 U/L (ref 39–117)
Anion gap: 2 — ABNORMAL LOW (ref 5–15)
BUN: 7 mg/dL (ref 6–23)
CO2: 23 mmol/L (ref 19–32)
Calcium: 8.2 mg/dL — ABNORMAL LOW (ref 8.4–10.5)
Chloride: 113 mmol/L — ABNORMAL HIGH (ref 96–112)
Creatinine, Ser: 0.6 mg/dL (ref 0.50–1.10)
GFR calc Af Amer: >90 mL/min (ref >90)
GFR calc non Af Amer: >90 mL/min (ref >90)
Glucose, Bld: 92 mg/dL (ref 70–99)
Potassium: 4.1 mmol/L (ref 3.5–5.1)
Sodium: 138 mmol/L (ref 135–145)
Total Bilirubin: 0.5 mg/dL (ref 0.3–1.2)
Total Protein: 7.7 g/dL (ref 6.0–8.3)

## 2014-08-17 MED ORDER — KETOROLAC TROMETHAMINE 30 MG/ML IJ SOLN
15.0000 mg | Freq: Once | INTRAMUSCULAR | Status: AC
  Administered 2014-08-17: 15 mg via INTRAVENOUS
  Filled 2014-08-17: qty 1

## 2014-08-17 MED ORDER — SODIUM CHLORIDE 0.9 % IV BOLUS (SEPSIS)
1000.0000 mL | Freq: Once | INTRAVENOUS | Status: AC
  Administered 2014-08-17: 1000 mL via INTRAVENOUS

## 2014-08-17 MED ORDER — MORPHINE SULFATE 2 MG/ML IJ SOLN
6.0000 mg | Freq: Once | INTRAMUSCULAR | Status: AC
  Administered 2014-08-17: 6 mg via INTRAVENOUS
  Filled 2014-08-17: qty 3

## 2014-08-17 MED ORDER — ONDANSETRON HCL 4 MG/2ML IJ SOLN
4.0000 mg | Freq: Once | INTRAMUSCULAR | Status: DC
  Filled 2014-08-17: qty 2

## 2014-08-17 MED ORDER — ONDANSETRON HCL 4 MG/2ML IJ SOLN
4.0000 mg | Freq: Once | INTRAMUSCULAR | Status: AC
  Administered 2014-08-17: 4 mg via INTRAVENOUS

## 2014-08-17 NOTE — ED Notes (Signed)
Pt. C/o generalized body aches and cough x1 month. Pt. Also c/o left ear pain.

## 2014-08-17 NOTE — ED Provider Notes (Signed)
CSN: [id number]     Arrival date & time 08/17/14  1919 History  This chart was scribed for Raeford Razor, MD by Evon Slack, ED Scribe. This patient was seen in room APA01/APA01 and the patient's care was started at 7:51 PM.      Chief Complaint  Patient presents with  . Generalized Body Aches   The history is provided by the patient. No language interpreter was used.   HPI Comments: [name] is a 34 y.o. female who presents to the Emergency Department complaining of chest pain onset 1 week prior described as a tightness. Pt states that the pain radiates to her back between her shoulder blades. Pt states that the pain is worse when coughing. Pt states she has had a worsening cough for the past month. Pt states she has developed left ear pain over the past 2-3 days with associated dizziness. Pt states she also has some nausea, subjective fever and voice change. Pt denies any medications PTA.  Pt is a current everyday smoker. Pt denies diarrhea, abdominal pain, or difficulty urinating. Pt is also requesting to get her liver enzymes checked. Pt states she drinks alcohol almost every other day.     Past Medical History  Diagnosis Date  . Seizures   . Bipolar 1 disorder   . Anxiety   . Hepatitis C    Past Surgical History  Procedure Laterality Date  . Tubal ligation     No family history on file. History  Substance Use Topics  . Smoking status: Current Every Day Smoker -- 0.50 packs/day    Types: Cigarettes  . Smokeless tobacco: Not on file  . Alcohol Use: 0.6 oz/week    1 Cans of beer per week     Comment: daily   OB History    No data available      Review of Systems  Constitutional: Positive for fever.  HENT: Positive for ear pain and voice change. Negative for sore throat.   Respiratory: Positive for cough and chest tightness.   Gastrointestinal: Positive for nausea. Negative for abdominal pain and diarrhea.  Genitourinary: Negative for difficulty urinating.   Neurological: Positive for dizziness.  All other systems reviewed and are negative.    Allergies  Review of patient's allergies indicates no known allergies.  Home Medications   Prior to Admission medications   Medication Sig Start Date End Date Taking? Authorizing Provider  diphenhydramine-acetaminophen (TYLENOL PM) 25-500 MG TABS Take 1 tablet by mouth once as needed (for pain/sleep).   Yes Historical Provider, MD  HYDROcodone-acetaminophen (NORCO/VICODIN) 5-325 MG per tablet Take 1 tablet by mouth every 6 (six) hours as needed for moderate pain.   Yes Historical Provider, MD  Pseudoephedrine-APAP-DM (DAYQUIL MULTI-SYMPTOM COLD/FLU PO) Take 10-15 mLs by mouth once as needed (FOR COUGH).   Yes Historical Provider, MD  amoxicillin (AMOXIL) 500 MG capsule Take 2 capsules (1,000 mg total) by mouth 2 (two) times daily. 08/18/14   Raeford Razor, MD  guaiFENesin-codeine [facility]) 100-10 MG/5ML syrup Take 5 mLs by mouth 3 (three) times daily as needed for cough. 08/18/14   Raeford Razor, MD  traMADol (ULTRAM) 50 MG tablet Take 1 tablet (50 mg total) by mouth every 6 (six) hours as needed. 08/18/14   Raeford Razor, MD   BP 120/83 mmHg  Pulse 60  Temp(Src) 98.2 F (36.8 C) (Oral)  Resp 19  Ht  (1.651 m)  Wt 179 lb (81.194 kg)  BMI 29.79 kg/m2  SpO2 98%  LMP 08/13/2014   Physical Exam  Constitutional: She is oriented to person, place, and time. She appears well-developed and well-nourished.  HENT:  Head: Normocephalic and atraumatic.  Right Ear: Tympanic membrane and external ear normal.  Left Ear: Tympanic membrane is not perforated and not retracted.  Mouth/Throat: Oropharynx is clear and moist. No oropharyngeal exudate.  Left TM opaque. Bony landmarks indistinct. No perforation. Ext aud canal clear  Eyes: Conjunctivae and EOM are normal. Pupils are equal, round, and reactive to light.  Neck: Normal range of motion. Neck supple.  Cardiovascular: Normal rate and regular  rhythm.   Pulmonary/Chest: Effort normal and breath sounds normal.  Abdominal: Soft. Bowel sounds are normal.  Musculoskeletal: Normal range of motion.  Lymphadenopathy:    She has no cervical adenopathy.  Neurological: She is alert and oriented to person, place, and time.  Skin: Skin is warm and dry.  Psychiatric: She has a normal mood and affect. Her behavior is normal.  Nursing note and vitals reviewed.   ED Course  Procedures (including critical care time) DIAGNOSTIC STUDIES: Oxygen Saturation is 100% on RA, normal by my interpretation.    COORDINATION OF CARE: 8:10 PM-Discussed treatment plan with pt at bedside and pt agreed to plan.     Labs Review Labs Reviewed  COMPREHENSIVE METABOLIC PANEL - Abnormal; Notable for the following:    Chloride 113 (*)    Calcium 8.2 (*)    AST 113 (*)    ALT 145 (*)    Anion gap 2 (*)    All other components within normal limits  CBC WITH DIFFERENTIAL/PLATELET - Abnormal; Notable for the following:    MCV 101.5 (*)    MCH 34.5 (*)    All other components within normal limits    Imaging Review Dg Chest 2 View  08/17/2014   CLINICAL DATA:  Cough for several days. Smoker for 19 years. Fever.  EXAM: CHEST  2 VIEW  COMPARISON:  11/09/2012  FINDINGS: The heart size and mediastinal contours are within normal limits. Both lungs are clear. The visualized skeletal structures are unremarkable.  IMPRESSION: No active cardiopulmonary disease.   Electronically Signed   By: Burman NievesWilliam  Stevens M.D.   On: 08/17/2014 21:08     EKG Interpretation None      MDM   Final diagnoses:  Cough  Acute left otitis media, recurrence not specified, unspecified otitis media type     I personally preformed the services scribed in my presence. The recorded information has been reviewed is accurate. Raeford RazorStephen Najmo Pardue, MD.      Raeford RazorStephen Chantay Whitelock, MD 08/23/14 [id number]

## 2014-08-17 NOTE — ED Notes (Signed)
Onset one month pt has not been feeling well,  pt complaining of cough,  Chest congestion, vertigo, ear ache

## 2014-08-18 MED ORDER — AMOXICILLIN 500 MG PO CAPS: 1000.0000 mg | ORAL_CAPSULE | Freq: Two times a day (BID) | ORAL | Status: DC

## 2014-08-18 MED ORDER — GUAIFENESIN-CODEINE 100-10 MG/5ML PO SYRP: 5.0000 mL | ORAL_SOLUTION | Freq: Three times a day (TID) | ORAL | Status: DC | PRN

## 2014-08-18 MED ORDER — TRAMADOL HCL 50 MG PO TABS: 50.0000 mg | ORAL_TABLET | Freq: Four times a day (QID) | ORAL | Status: DC | PRN

## 2014-08-18 NOTE — Discharge Instructions (Signed)
Cough, Adult  A cough is a reflex that helps clear your throat and airways. It can help heal the body or may be a reaction to an irritated airway. A cough may only last 2 or 3 weeks (acute) or may last more than 8 weeks (chronic).  CAUSES Acute cough:  Viral or bacterial infections. Chronic cough:  Infections.  Allergies.  Asthma.  Post-nasal drip.  Smoking.  Heartburn or acid reflux.  Some medicines.  Chronic lung problems (COPD).  Cancer. SYMPTOMS   Cough.  Fever.  Chest pain.  Increased breathing rate.  High-pitched whistling sound when breathing (wheezing).  Colored mucus that you cough up (sputum). TREATMENT   A bacterial cough may be treated with antibiotic medicine.  A viral cough must run its course and will not respond to antibiotics.  Your caregiver may recommend other treatments if you have a chronic cough. HOME CARE INSTRUCTIONS   Only take over-the-counter or prescription medicines for pain, discomfort, or fever as directed by your caregiver. Use cough suppressants only as directed by your caregiver.  Use a cold steam vaporizer or humidifier in your bedroom or home to help loosen secretions.  Sleep in a semi-upright position if your cough is worse at night.  Rest as needed.  Stop smoking if you smoke. SEEK IMMEDIATE MEDICAL CARE IF:   You have pus in your sputum.  Your cough starts to worsen.  You cannot control your cough with suppressants and are losing sleep.  You begin coughing up blood.  You have difficulty breathing.  You develop pain which is getting worse or is uncontrolled with medicine.  You have a fever. MAKE SURE YOU:   Understand these instructions.  Will watch your condition.  Will get help right away if you are not doing well or get worse. Document Released: 12/06/2010 Document Revised: 09/01/2011 Document Reviewed: 12/06/2010 [facility] Patient Information 2015 [place], [place]. This information is not intended  to replace advice given to you by your health care provider. Make sure you discuss any questions you have with your health care provider.  Otitis Media Otitis media is redness, soreness, and puffiness (swelling) in the space just behind your eardrum (middle ear). It may be caused by allergies or infection. It often happens along with a cold. HOME CARE  Take your medicine as told. Finish it even if you start to feel better.  Only take over-the-counter or prescription medicines for pain, discomfort, or fever as told by your doctor.  Follow up with your doctor as told. GET HELP IF:  You have otitis media only in one ear, or bleeding from your nose, or both.  You notice a lump on your neck.  You are not getting better in 3-5 days.  You feel worse instead of better. GET HELP RIGHT AWAY IF:   You have pain that is not helped with medicine.  You have puffiness, redness, or pain around your ear.  You get a stiff neck.  You cannot move part of your face (paralysis).  You notice that the bone behind your ear hurts when you touch it. MAKE SURE YOU:   Understand these instructions.  Will watch your condition.  Will get help right away if you are not doing well or get worse. Document Released: 11/26/2007 Document Revised: 06/14/2013 Document Reviewed: 01/04/2013 [facility] Patient Information 2015 [place], [place]. This information is not intended to replace advice given to you by your health care provider. Make sure you discuss any questions you have with your health  care provider.  

## 2014-08-23 IMAGING — CT CT CERVICAL SPINE W/O CM
3 of 5 series · 10 of 33 positions shown, 12 images · non-contrast
Comparison: Head CT 11/09/2012. Cervical spine radiographs
11/12/2010 and neck CT 12/17/2008.

CLINICAL DATA: Multiple seizures over the last 2 days. History of
seizure. Slurred speech with elevated blood pressure.

EXAM:
CT HEAD WITHOUT CONTRAST
CT CERVICAL SPINE WITHOUT CONTRAST
TECHNIQUE: Multidetector CT imaging of the head and cervical spine was
performed following the standard protocol without intravenous
contrast. Multiplanar CT image reconstructions of the cervical spine
were also generated.

[Series 5: cervical st 2.0 b31s · axial · 0.27mm/px · z∈[+154,+218]mm · 2 of 82 slices shown, 3 images]
[im 33/82  soft-tissue]
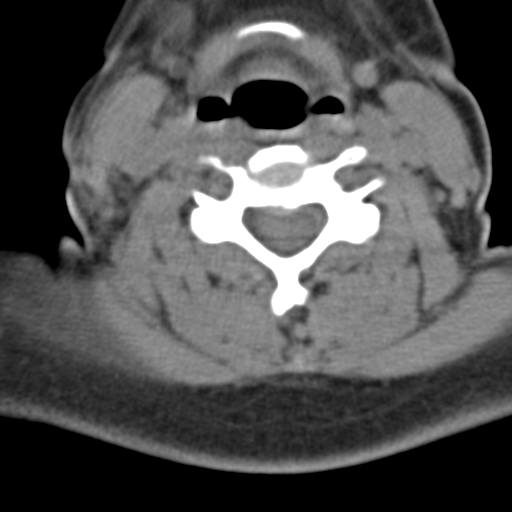
[im 33/82  bone]
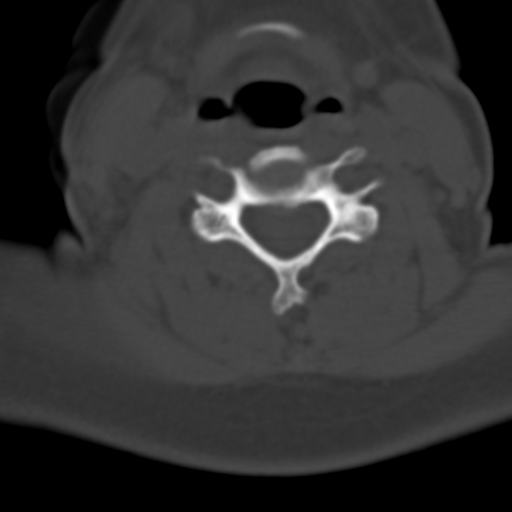
[im 65/82  bone]
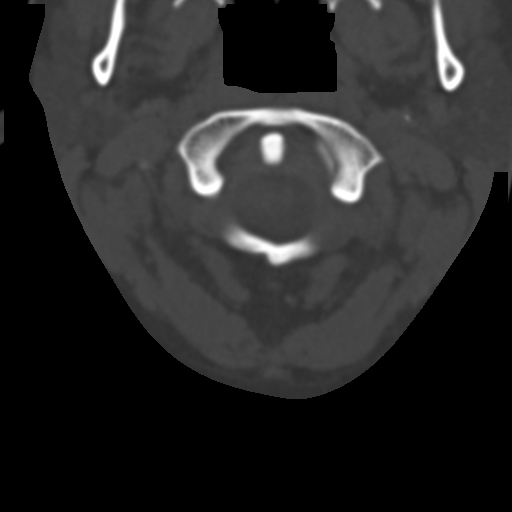

[Series 7: sagittal bone 2.0 · sagittal · 0.20mm/px · 5 of 37 slices shown, 6 images]
[im 13/37  bone]
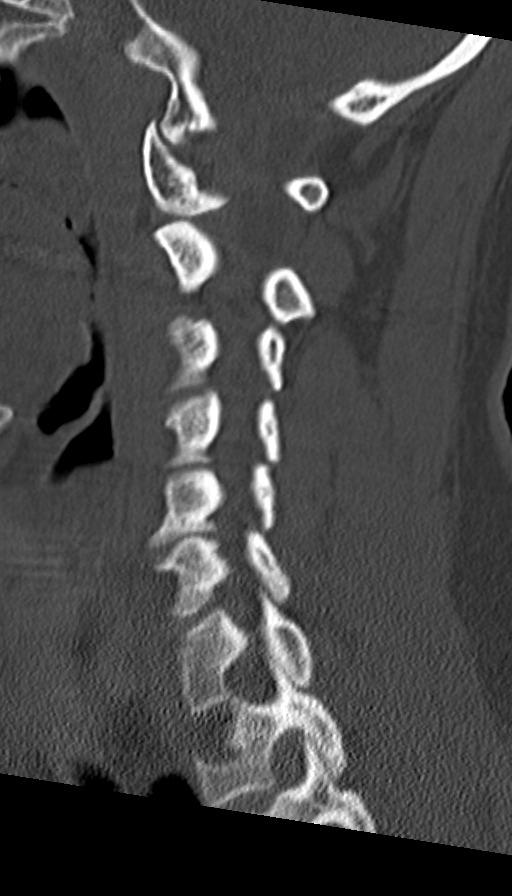
[im 16/37  bone]
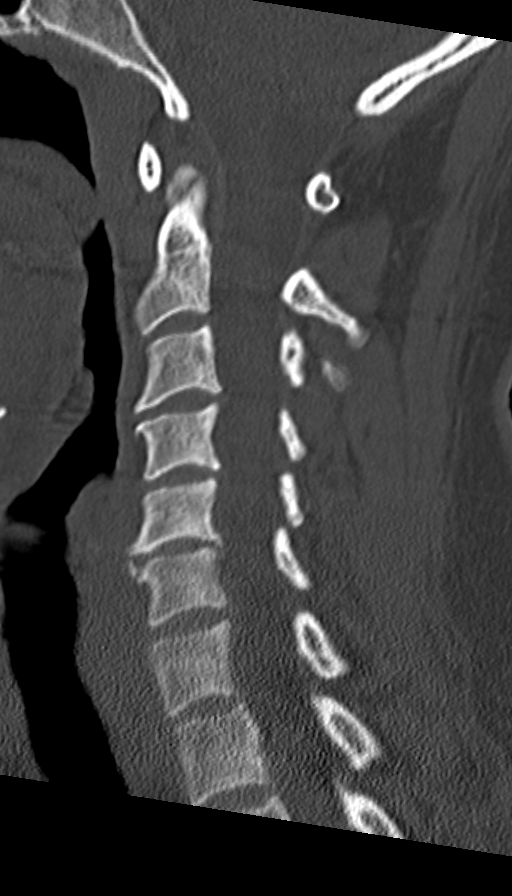
[im 19/37  soft-tissue]
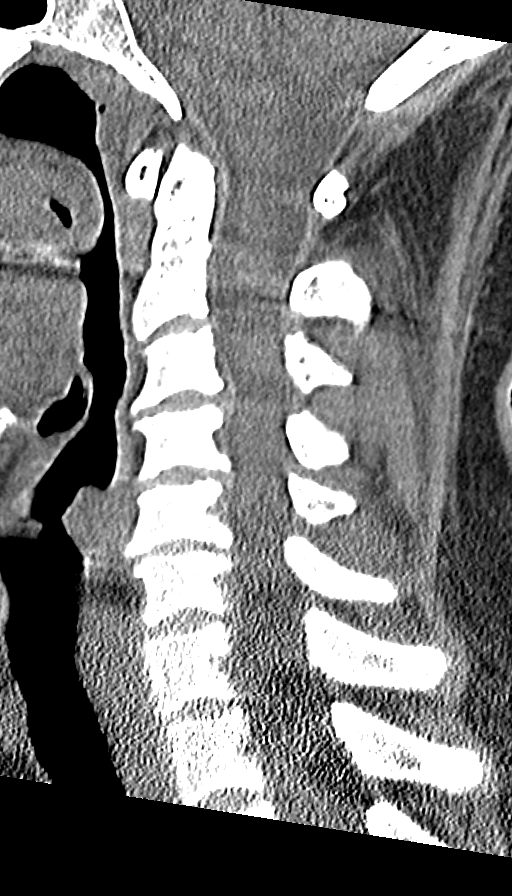
[im 19/37  bone]
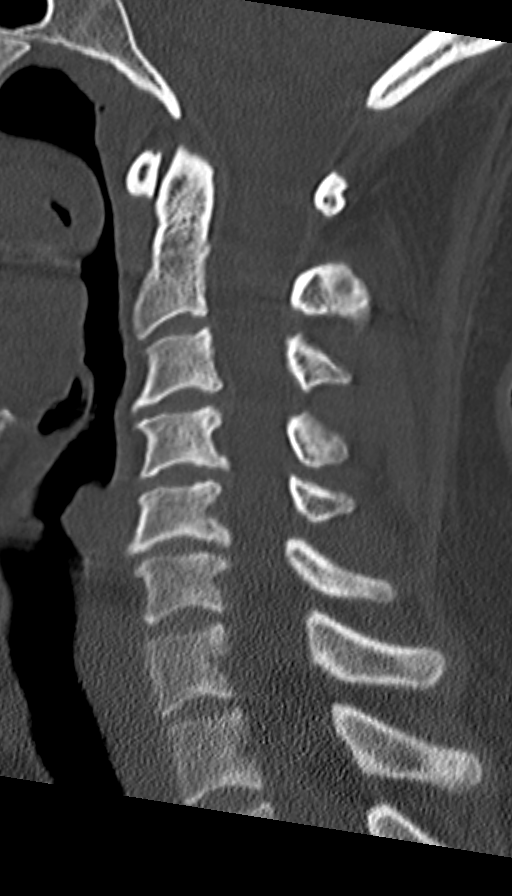
[im 22/37  bone]
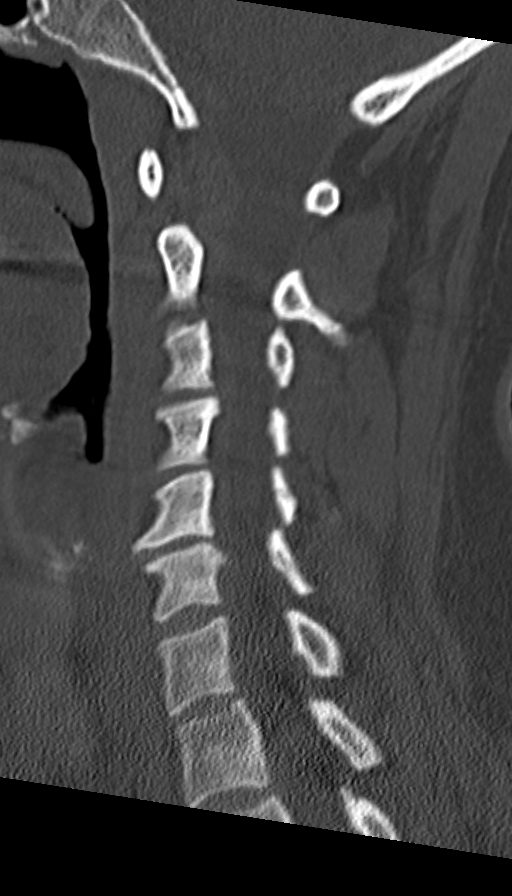
[im 25/37  bone]
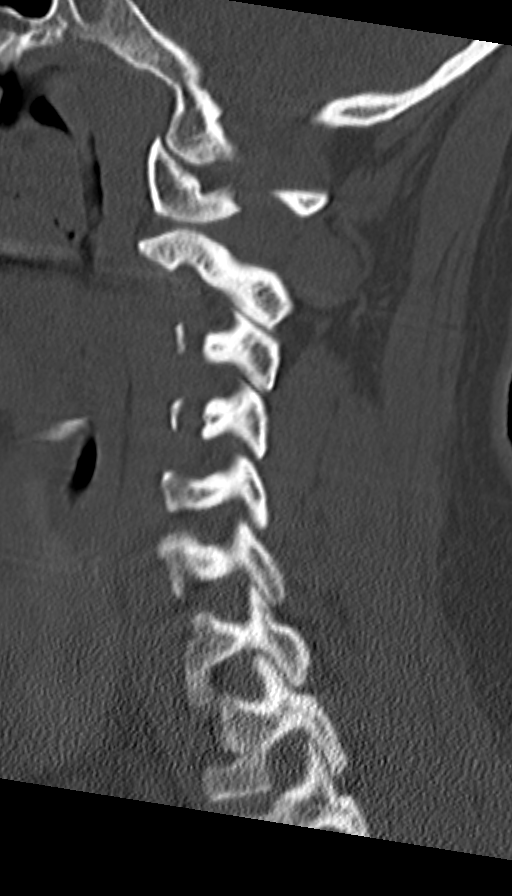

[Series 8: coronal bone 2.0 · coronal · 0.16mm/px · 3 of 53 slices shown]
[im 11/53  bone]
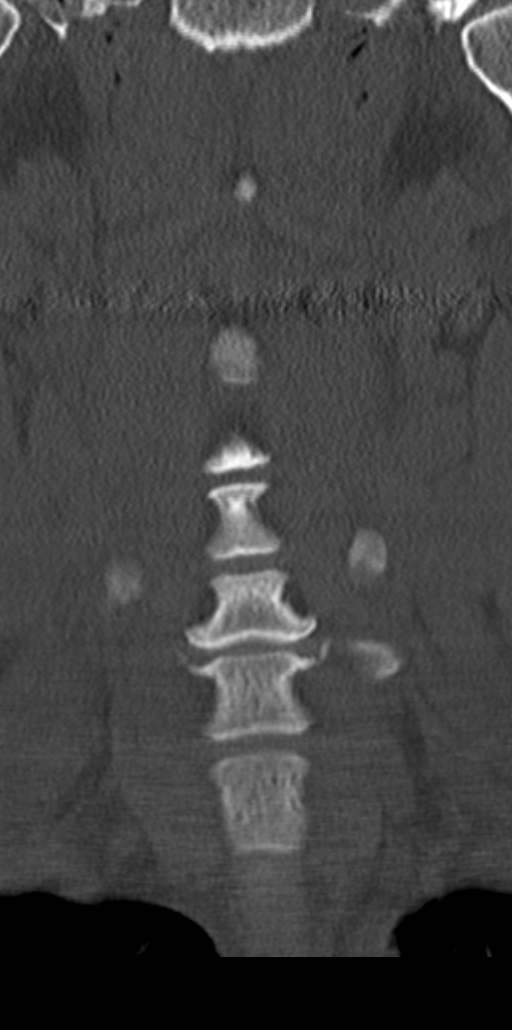
[im 21/53  bone]
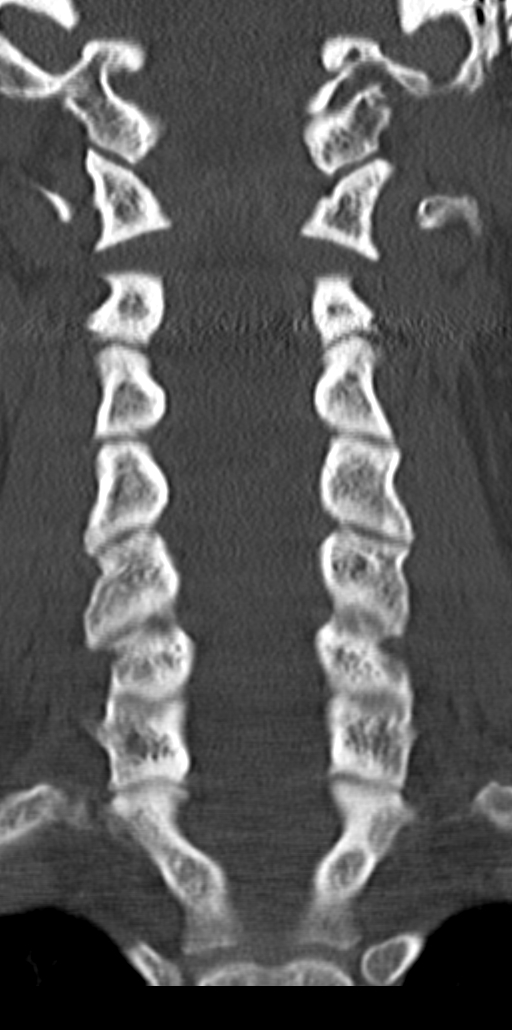
[im 32/53  bone]
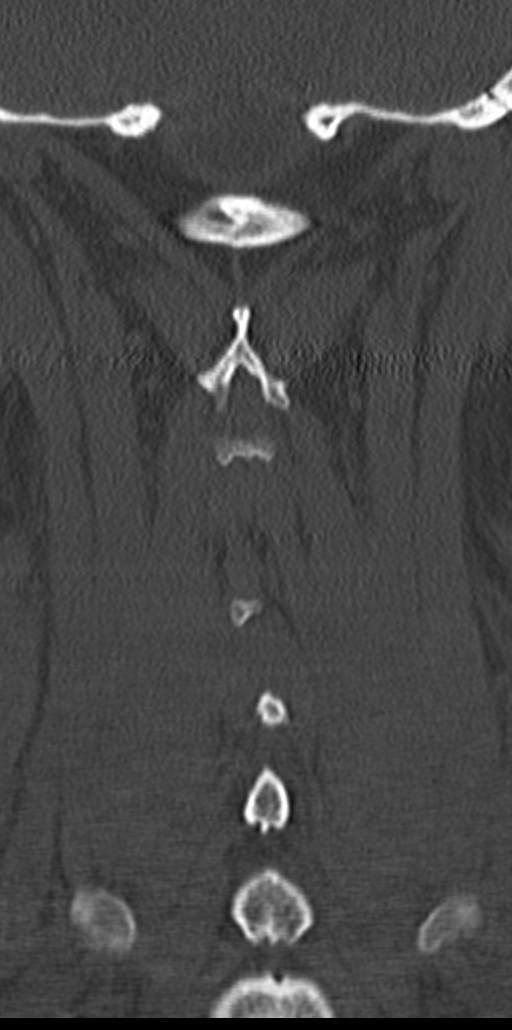

[10 of 33 positions shown; findings below may reference images not displayed]

FINDINGS: CT HEAD FINDINGS

There is no evidence of acute intracranial hemorrhage, mass lesion,
brain edema or extra-axial fluid collection. The ventricles and
subarachnoid spaces are appropriately sized for age. There is no CT
evidence of acute cortical infarction.

The visualized paranasal sinuses, mastoid air cells and middle ears
are clear. The calvarium is intact.

CT CERVICAL SPINE FINDINGS

The cervical alignment is normal. There is no evidence of acute
fracture or traumatic subluxation. No acute soft tissue findings are
evident. There is a left paracentral disc protrusion at C3-4. There
are left paracentral osteophytes at C4-5 and C5-6 but no high-grade
foraminal stenosis.
IMPRESSION: 1. No acute intracranial, calvarial or cervical spine findings
demonstrated.
2. Cervical spondylosis with left-sided disc protrusions and
osteophytes as described.

## 2014-11-04 ENCOUNTER — Encounter (HOSPITAL_COMMUNITY): Payer: Self-pay

## 2014-11-04 ENCOUNTER — Emergency Department (HOSPITAL_COMMUNITY): Payer: Self-pay

## 2014-11-04 ENCOUNTER — Emergency Department (HOSPITAL_COMMUNITY)
Admission: EM | Admit: 2014-11-04 | Discharge: 2014-11-04 | Disposition: A | Discharge status: Home / Self Care | Payer: Self-pay | Attending: Emergency Medicine | Admitting: Emergency Medicine

## 2014-11-04 DIAGNOSIS — Z8669 Personal history of other diseases of the nervous system and sense organs: Secondary | ICD-10-CM | POA: Insufficient documentation

## 2014-11-04 DIAGNOSIS — M542 Cervicalgia: Secondary | ICD-10-CM | POA: Insufficient documentation

## 2014-11-04 DIAGNOSIS — Z8619 Personal history of other infectious and parasitic diseases: Secondary | ICD-10-CM | POA: Insufficient documentation

## 2014-11-04 DIAGNOSIS — Z720 Tobacco use: Secondary | ICD-10-CM | POA: Insufficient documentation

## 2014-11-04 DIAGNOSIS — Z792 Long term (current) use of antibiotics: Secondary | ICD-10-CM | POA: Insufficient documentation

## 2014-11-04 DIAGNOSIS — F419 Anxiety disorder, unspecified: Secondary | ICD-10-CM | POA: Insufficient documentation

## 2014-11-04 DIAGNOSIS — Z9089 Acquired absence of other organs: Secondary | ICD-10-CM | POA: Insufficient documentation

## 2014-11-04 DIAGNOSIS — J029 Acute pharyngitis, unspecified: Secondary | ICD-10-CM

## 2014-11-04 DIAGNOSIS — J040 Acute laryngitis: Secondary | ICD-10-CM | POA: Insufficient documentation

## 2014-11-04 LAB — CBC WITH DIFFERENTIAL/PLATELET
Basophils Absolute: 0 10*3/uL (ref 0.0–0.1)
Basophils Relative: 1 % (ref 0–1)
Eosinophils Absolute: 0.1 10*3/uL (ref 0.0–0.7)
Eosinophils Relative: 2 % (ref 0–5)
HCT: 39.3 % (ref 36.0–46.0)
HEMOGLOBIN: 13.5 g/dL (ref 12.0–15.0)
Lymphocytes Relative: 43 % (ref 12–46)
Lymphs Abs: 2.2 10*3/uL (ref 0.7–4.0)
MCH: 34.8 pg — AB (ref 26.0–34.0)
MCHC: 34.4 g/dL (ref 30.0–36.0)
MCV: 101.3 fL — ABNORMAL HIGH (ref 78.0–100.0)
MONO ABS: 0.5 10*3/uL (ref 0.1–1.0)
Monocytes Relative: 10 % (ref 3–12)
NEUTROS PCT: 44 % (ref 43–77)
Neutro Abs: 2.2 10*3/uL (ref 1.7–7.7)
PLATELETS: 144 10*3/uL — AB (ref 150–400)
RBC: 3.88 MIL/uL (ref 3.87–5.11)
RDW: 12.9 % (ref 11.5–15.5)
WBC: 5.1 10*3/uL (ref 4.0–10.5)

## 2014-11-04 LAB — BASIC METABOLIC PANEL
Anion gap: 9 (ref 5–15)
BUN: 10 mg/dL (ref 6–20)
CHLORIDE: 107 mmol/L (ref 101–111)
CO2: 22 mmol/L (ref 22–32)
Calcium: 8.1 mg/dL — ABNORMAL LOW (ref 8.9–10.3)
Creatinine, Ser: 0.82 mg/dL (ref 0.44–1.00)
GFR calc Af Amer: >60 mL/min (ref >60)
GFR calc non Af Amer: >60 mL/min (ref >60)
Glucose, Bld: 100 mg/dL — ABNORMAL HIGH (ref 65–99)
POTASSIUM: 3.9 mmol/L (ref 3.5–5.1)
SODIUM: 138 mmol/L (ref 135–145)

## 2014-11-04 MED ORDER — ONDANSETRON HCL 4 MG/2ML IJ SOLN
4.0000 mg | Freq: Once | INTRAMUSCULAR | Status: AC
Start: 2014-11-04 — End: 2014-11-04
  Administered 2014-11-04: 4 mg via INTRAVENOUS
  Filled 2014-11-04: qty 2

## 2014-11-04 MED ORDER — SODIUM CHLORIDE 0.9 % IV SOLN
INTRAVENOUS | Status: DC
  Administered 2014-11-04: 19:00:00 via INTRAVENOUS

## 2014-11-04 MED ORDER — SODIUM CHLORIDE 0.9 % IV BOLUS (SEPSIS)
500.0000 mL | Freq: Once | INTRAVENOUS | Status: AC
Start: 2014-11-04 — End: 2014-11-04
  Administered 2014-11-04: 500 mL via INTRAVENOUS

## 2014-11-04 MED ORDER — HYDROMORPHONE HCL 1 MG/ML IJ SOLN
1.0000 mg | Freq: Once | INTRAMUSCULAR | Status: AC
  Administered 2014-11-04: 1 mg via INTRAVENOUS
  Filled 2014-11-04: qty 1

## 2014-11-04 MED ORDER — IOHEXOL 300 MG/ML  SOLN
75.0000 mL | Freq: Once | INTRAMUSCULAR | Status: AC | PRN
  Administered 2014-11-04: 75 mL via INTRAVENOUS

## 2014-11-04 MED ORDER — CLINDAMYCIN HCL 300 MG PO CAPS: 300.0000 mg | ORAL_CAPSULE | Freq: Four times a day (QID) | ORAL | Status: DC

## 2014-11-04 MED ORDER — CLINDAMYCIN HCL 150 MG PO CAPS
300.0000 mg | ORAL_CAPSULE | Freq: Once | ORAL | Status: AC
  Administered 2014-11-04: 300 mg via ORAL
  Filled 2014-11-04: qty 2

## 2014-11-04 NOTE — ED Notes (Signed)
Pt reports sore area on throat x 1 week.  Reports today, area has gotten bigger and feels like throat is swelling.

## 2014-11-04 NOTE — ED Provider Notes (Signed)
CSN: [id number]     Arrival date & time 11/04/14  1655 History   First MD Initiated Contact with Patient 11/04/14 1728     Chief Complaint  Patient presents with  . throat lesion   . Oral Swelling     (Consider location/radiation/quality/duration/timing/severity/associated sxs/prior Treatment) The history is provided by the patient.     [name] is a 34 y.o. female who presents for evaluation of sore throat, laryngitis, painful swallowing, and worsening symptoms for 4 weeks. No prior similar problems. She is a smoker. She denies fever, chills, cough, shortness of breath or dizziness. There are no other known modifying factors.   Past Medical History  Diagnosis Date  . Seizures   . Bipolar 1 disorder   . Anxiety   . Hepatitis C    Past Surgical History  Procedure Laterality Date  . Tubal ligation     No family history on file. History  Substance Use Topics  . Smoking status: Current Every Day Smoker -- 0.50 packs/day    Types: Cigarettes  . Smokeless tobacco: Not on file  . Alcohol Use: 0.6 oz/week    1 Cans of beer per week     Comment: daily   OB History    No data available     Review of Systems  All other systems reviewed and are negative.     Allergies  Review of patient's allergies indicates no known allergies.  Home Medications   Prior to Admission medications   Medication Sig Start Date End Date Taking? Authorizing Provider  amoxicillin (AMOXIL) 500 MG capsule Take 2 capsules (1,000 mg total) by mouth 2 (two) times daily. Patient not taking: Reported on 11/04/2014 08/18/14   Raeford RazorStephen Kohut, MD  diphenhydramine-acetaminophen (TYLENOL PM) 25-500 MG TABS Take 1 tablet by mouth once as needed (for pain/sleep).    Historical Provider, MD  guaiFENesin-codeine (ROBITUSSIN AC) 100-10 MG/5ML syrup Take 5 mLs by mouth 3 (three) times daily as needed for cough. Patient not taking: Reported on 11/04/2014 08/18/14   Raeford RazorStephen Kohut, MD  Pseudoephedrine-APAP-DM  (DAYQUIL MULTI-SYMPTOM COLD/FLU PO) Take 10-15 mLs by mouth once as needed (FOR COUGH).    Historical Provider, MD  traMADol (ULTRAM) 50 MG tablet Take 1 tablet (50 mg total) by mouth every 6 (six) hours as needed. Patient not taking: Reported on 11/04/2014 08/18/14   Raeford RazorStephen Kohut, MD   BP 136/87 mmHg  Pulse 108  Temp(Src) 97.8 F (36.6 C) (Oral)  Resp 20  Ht 5\' 5"  (1.651 m)  Wt 170 lb (77.111 kg)  BMI 28.29 kg/m2  SpO2 98%  LMP 09/14/2014 Physical Exam  Constitutional: She is oriented to person, place, and time. She appears well-developed and well-nourished. She appears distressed (She is uncomfortable).  HENT:  Head: Normocephalic and atraumatic.  Right Ear: External ear normal.  Left Ear: External ear normal.  Status post tonsillectomy, bilaterally. Right soft palate with flat lesion about 1 x 2 cm, characterized by mild ulceration and bleeding. No oral deformity. No trismus.  Eyes: Conjunctivae and EOM are normal. Pupils are equal, round, and reactive to light.  Neck: Normal range of motion and phonation normal. Neck supple.  Mild diffuse tenderness over the anterior neck, upper, without palpable mass or significant adenopathy.  Cardiovascular: Normal rate, regular rhythm and normal heart sounds.   Pulmonary/Chest: Effort normal and breath sounds normal. She exhibits no bony tenderness.  Abdominal: Soft. There is no tenderness.  Musculoskeletal: Normal range of motion.  Neurological: She is alert  and oriented to person, place, and time. No cranial nerve deficit or sensory deficit. She exhibits normal muscle tone. Coordination normal.  Skin: Skin is warm, dry and intact.  Psychiatric: She has a normal mood and affect. Her behavior is normal. Judgment and thought content normal.  Nursing note and vitals reviewed.   ED Course  Procedures (including critical care time)  Medications  0.9 %  sodium chloride infusion ( Intravenous New Bag/Given 11/04/14 1842)  clindamycin (CLEOCIN)  capsule 300 mg (not administered)  sodium chloride 0.9 % bolus 500 mL (500 mLs Intravenous New Bag/Given 11/04/14 1817)  ondansetron (ZOFRAN) injection 4 mg (4 mg Intravenous Given 11/04/14 1839)  HYDROmorphone (DILAUDID) injection 1 mg (1 mg Intravenous Given 11/04/14 1839)  iohexol (OMNIPAQUE) 300 MG/ML solution 75 mL (75 mLs Intravenous Contrast Given 11/04/14 1852)    Patient Vitals for the past 24 hrs:  BP Temp Temp src Pulse Resp SpO2 Height Weight  11/04/14 2044 100/74 mmHg - - 68 20 95 % - -  11/04/14 1705 136/87 mmHg - - - - - - -  11/04/14 1704 - 97.8 F (36.6 C) Oral 108 20 98 % 5\' 5"  (1.651 m) 170 lb (77.111 kg)    At discharge- Reevaluation with update and discussion. After initial assessment and treatment, an updated evaluation reveals she is comfortable. No respiratory distress. She is able to speak somewhat better at this time. The findings were discussed with the patient and all questions were answered. Javis Abboud L    Labs Review Labs Reviewed  BASIC METABOLIC PANEL  CBC WITH DIFFERENTIAL/PLATELET    Imaging Review No results found.   EKG Interpretation None      MDM   Final diagnoses:  Sore throat  Laryngitis    Nonspecific pharyngitis, with laryngitis. Doubt serous bacterial infection, tumor, impending airway obstruction or chest infection.  Nursing Notes Reviewed/ Care Coordinated Applicable Imaging Reviewed Interpretation of Laboratory Data incorporated into ED treatment  The patient appears reasonably screened and/or stabilized for discharge and I doubt any other medical condition or other [facility] requiring further screening, evaluation, or treatment in the ED at this time prior to discharge.  Plan: Home Medications- Clindamycin; Home Treatments- Rest; return here if the recommended treatment, does not improve the symptoms; Recommended follow up- PCP prn. ENT 1 week for check up     Mancel BaleElliott Abir Eroh, MD 11/04/14 2122

## 2014-11-04 NOTE — Discharge Instructions (Signed)
Laryngitis At the top of your windpipe is your voice box. It is the source of your voice. Inside your voice box are 2 bands of muscles called vocal cords. When you breathe, your vocal cords are relaxed and open so that air can get into the lungs. When you decide to say something, these cords come together and vibrate. The sound from these vibrations goes into your throat and comes out through your mouth as sound. Laryngitis is an inflammation of the vocal cords that causes hoarseness, cough, loss of voice, sore throat, and dry throat. Laryngitis can be temporary (acute) or long-term (chronic). Most cases of acute laryngitis improve with time.Chronic laryngitis lasts for more than 3 weeks. CAUSES Laryngitis can often be related to excessive smoking, talking, or yelling, as well as inhalation of toxic fumes and allergies. Acute laryngitis is usually caused by a viral infection, vocal strain, measles or mumps, or bacterial infections. Chronic laryngitis is usually caused by vocal cord strain, vocal cord injury, postnasal drip, growths on the vocal cords, or acid reflux. SYMPTOMS   Cough.  Sore throat.  Dry throat. RISK FACTORS  Respiratory infections.  Exposure to irritating substances, such as cigarette smoke, excessive amounts of alcohol, stomach acids, and workplace chemicals.  Voice trauma, such as vocal cord injury from shouting or speaking too loud. DIAGNOSIS  Your cargiver will perform a physical exam. During the physical exam, your caregiver will examine your throat. The most common sign of laryngitis is hoarseness. Laryngoscopy may be necessary to confirm the diagnosis of this condition. This procedure allows your caregiver to look into the larynx. HOME CARE INSTRUCTIONS  Drink enough fluids to keep your urine clear or pale yellow.  Rest until you no longer have symptoms or as directed by your caregiver.  Breathe in moist air.  Take all medicine as directed by your  caregiver.  Do not smoke.  Talk as little as possible (this includes whispering).  Write on paper instead of talking until your voice is back to normal.  Follow up with your caregiver if your condition has not improved after 10 days. SEEK MEDICAL CARE IF:   You have trouble breathing.  You cough up blood.  You have persistent fever.  You have increasing pain.  You have difficulty swallowing. MAKE SURE YOU:  Understand these instructions.  Will watch your condition.  Will get help right away if you are not doing well or get worse. Document Released: 06/09/2005 Document Revised: 09/01/2011 Document Reviewed: 08/15/2010 [facility] Patient Information 2015 [place], [place]. This information is not intended to replace advice given to you by your health care provider. Make sure you discuss any questions you have with your health care provider.  Pharyngitis Pharyngitis is redness, pain, and swelling (inflammation) of your pharynx.  CAUSES  Pharyngitis is usually caused by infection. Most of the time, these infections are from viruses (viral) and are part of a cold. However, sometimes pharyngitis is caused by bacteria (bacterial). Pharyngitis can also be caused by allergies. Viral pharyngitis may be spread from person to person by coughing, sneezing, and personal items or utensils (cups, forks, spoons, toothbrushes). Bacterial pharyngitis may be spread from person to person by more intimate contact, such as kissing.  SIGNS AND SYMPTOMS  Symptoms of pharyngitis include:   Sore throat.   Tiredness (fatigue).   Low-grade fever.   Headache.  Joint pain and muscle aches.  Skin rashes.  Swollen lymph nodes.  Plaque-like film on throat or tonsils (often seen with bacterial pharyngitis).  DIAGNOSIS  Your health care provider will ask you questions about your illness and your symptoms. Your medical history, along with a physical exam, is often all that is needed to diagnose  pharyngitis. Sometimes, a rapid strep test is done. Other lab tests may also be done, depending on the suspected cause.  TREATMENT  Viral pharyngitis will usually get better in 3-4 days without the use of medicine. Bacterial pharyngitis is treated with medicines that kill germs (antibiotics).  HOME CARE INSTRUCTIONS   Drink enough water and fluids to keep your urine clear or pale yellow.   Only take over-the-counter or prescription medicines as directed by your health care provider:   If you are prescribed antibiotics, make sure you finish them even if you start to feel better.   Do not take aspirin.   Get lots of rest.   Gargle with 8 oz of salt water ( tsp of salt per 1 qt of water) as often as every 1-2 hours to soothe your throat.   Throat lozenges (if you are not at risk for choking) or sprays may be used to soothe your throat. SEEK MEDICAL CARE IF:   You have large, tender lumps in your neck.  You have a rash.  You cough up green, yellow-brown, or bloody spit. SEEK IMMEDIATE MEDICAL CARE IF:   Your neck becomes stiff.  You drool or are unable to swallow liquids.  You vomit or are unable to keep medicines or liquids down.  You have severe pain that does not go away with the use of recommended medicines.  You have trouble breathing (not caused by a stuffy nose). MAKE SURE YOU:   Understand these instructions.  Will watch your condition.  Will get help right away if you are not doing well or get worse. Document Released: 06/09/2005 Document Revised: 03/30/2013 Document Reviewed: 02/14/2013 [facility] Patient Information 2015 [place], [place]. This information is not intended to replace advice given to you by your health care provider. Make sure you discuss any questions you have with your health care provider.

## 2014-12-21 ENCOUNTER — Ambulatory Visit (HOSPITAL_COMMUNITY)
Admission: RE | Admit: 2014-12-21 | Discharge: 2014-12-21 | Disposition: A | Discharge status: Home / Self Care | Payer: Self-pay | Source: Ambulatory Visit | Attending: Physician Assistant | Admitting: Physician Assistant

## 2014-12-21 ENCOUNTER — Other Ambulatory Visit (HOSPITAL_COMMUNITY): Payer: Self-pay | Admitting: Physician Assistant

## 2014-12-21 DIAGNOSIS — R05 Cough: Secondary | ICD-10-CM

## 2014-12-21 DIAGNOSIS — R0989 Other specified symptoms and signs involving the circulatory and respiratory systems: Secondary | ICD-10-CM | POA: Insufficient documentation

## 2014-12-21 DIAGNOSIS — R059 Cough, unspecified: Secondary | ICD-10-CM

## 2014-12-21 DIAGNOSIS — R509 Fever, unspecified: Secondary | ICD-10-CM | POA: Insufficient documentation

## 2014-12-21 DIAGNOSIS — Z87891 Personal history of nicotine dependence: Secondary | ICD-10-CM | POA: Insufficient documentation

## 2014-12-28 ENCOUNTER — Emergency Department (HOSPITAL_COMMUNITY)
Admission: EM | Admit: 2014-12-28 | Discharge: 2014-12-28 | Disposition: A | Discharge status: Home / Self Care | Payer: Self-pay | Attending: Emergency Medicine | Admitting: Emergency Medicine

## 2014-12-28 ENCOUNTER — Encounter (HOSPITAL_COMMUNITY): Payer: Self-pay | Admitting: Emergency Medicine

## 2014-12-28 ENCOUNTER — Emergency Department (HOSPITAL_COMMUNITY): Payer: Self-pay

## 2014-12-28 DIAGNOSIS — Z720 Tobacco use: Secondary | ICD-10-CM | POA: Insufficient documentation

## 2014-12-28 DIAGNOSIS — Z8669 Personal history of other diseases of the nervous system and sense organs: Secondary | ICD-10-CM | POA: Insufficient documentation

## 2014-12-28 DIAGNOSIS — Z8619 Personal history of other infectious and parasitic diseases: Secondary | ICD-10-CM | POA: Insufficient documentation

## 2014-12-28 DIAGNOSIS — Z792 Long term (current) use of antibiotics: Secondary | ICD-10-CM | POA: Insufficient documentation

## 2014-12-28 DIAGNOSIS — H9209 Otalgia, unspecified ear: Secondary | ICD-10-CM | POA: Insufficient documentation

## 2014-12-28 DIAGNOSIS — J9801 Acute bronchospasm: Secondary | ICD-10-CM | POA: Insufficient documentation

## 2014-12-28 DIAGNOSIS — Z8659 Personal history of other mental and behavioral disorders: Secondary | ICD-10-CM | POA: Insufficient documentation

## 2014-12-28 LAB — CBC WITH DIFFERENTIAL/PLATELET
BASOS ABS: 0 10*3/uL (ref 0.0–0.1)
Basophils Relative: 0 % (ref 0–1)
EOS ABS: 0.1 10*3/uL (ref 0.0–0.7)
Eosinophils Relative: 1 % (ref 0–5)
HCT: 43.3 % (ref 36.0–46.0)
Hemoglobin: 15.2 g/dL — ABNORMAL HIGH (ref 12.0–15.0)
Lymphocytes Relative: 17 % (ref 12–46)
Lymphs Abs: 1.9 10*3/uL (ref 0.7–4.0)
MCH: 35.4 pg — AB (ref 26.0–34.0)
MCHC: 35.1 g/dL (ref 30.0–36.0)
MCV: 100.9 fL — ABNORMAL HIGH (ref 78.0–100.0)
Monocytes Absolute: 1.1 10*3/uL — ABNORMAL HIGH (ref 0.1–1.0)
Monocytes Relative: 10 % (ref 3–12)
NEUTROS PCT: 72 % (ref 43–77)
Neutro Abs: 8 10*3/uL — ABNORMAL HIGH (ref 1.7–7.7)
PLATELETS: 153 10*3/uL (ref 150–400)
RBC: 4.29 MIL/uL (ref 3.87–5.11)
RDW: 12.4 % (ref 11.5–15.5)
WBC: 11 10*3/uL — ABNORMAL HIGH (ref 4.0–10.5)

## 2014-12-28 LAB — COMPREHENSIVE METABOLIC PANEL
ALT: 95 U/L — ABNORMAL HIGH (ref 14–54)
AST: 110 U/L — AB (ref 15–41)
Albumin: 4.3 g/dL (ref 3.5–5.0)
Alkaline Phosphatase: 66 U/L (ref 38–126)
Anion gap: 8 (ref 5–15)
BUN: 11 mg/dL (ref 6–20)
CALCIUM: 8.9 mg/dL (ref 8.9–10.3)
CO2: 24 mmol/L (ref 22–32)
Chloride: 106 mmol/L (ref 101–111)
Creatinine, Ser: 0.6 mg/dL (ref 0.44–1.00)
GFR calc Af Amer: >60 mL/min (ref >60)
Glucose, Bld: 103 mg/dL — ABNORMAL HIGH (ref 65–99)
Potassium: 3.9 mmol/L (ref 3.5–5.1)
Sodium: 138 mmol/L (ref 135–145)
Total Bilirubin: 1 mg/dL (ref 0.3–1.2)
Total Protein: 7.7 g/dL (ref 6.5–8.1)

## 2014-12-28 MED ORDER — PREDNISONE 10 MG PO TABS: 20.0000 mg | ORAL_TABLET | Freq: Every day | ORAL | Status: DC

## 2014-12-28 MED ORDER — ALBUTEROL SULFATE (2.5 MG/3ML) 0.083% IN NEBU
2.5000 mg | INHALATION_SOLUTION | Freq: Once | RESPIRATORY_TRACT | Status: AC
Start: 2014-12-28 — End: 2014-12-28
  Administered 2014-12-28: 2.5 mg via RESPIRATORY_TRACT
  Filled 2014-12-28: qty 3

## 2014-12-28 MED ORDER — METHYLPREDNISOLONE SODIUM SUCC 125 MG IJ SOLR
125.0000 mg | Freq: Once | INTRAMUSCULAR | Status: AC
  Administered 2014-12-28: 125 mg via INTRAMUSCULAR
  Filled 2014-12-28: qty 2

## 2014-12-28 MED ORDER — IPRATROPIUM-ALBUTEROL 0.5-2.5 (3) MG/3ML IN SOLN
3.0000 mL | Freq: Once | RESPIRATORY_TRACT | Status: AC
  Administered 2014-12-28: 3 mL via RESPIRATORY_TRACT
  Filled 2014-12-28: qty 3

## 2014-12-28 MED ORDER — ALBUTEROL SULFATE HFA 108 (90 BASE) MCG/ACT IN AERS
2.0000 | INHALATION_SPRAY | RESPIRATORY_TRACT | Status: DC | PRN
  Administered 2014-12-28: 2 via RESPIRATORY_TRACT
  Filled 2014-12-28: qty 6.7

## 2014-12-28 NOTE — ED Notes (Signed)
Pt reports cough, hoarseness and otalgia since January. Pt states she has been seen mult times for same.

## 2014-12-28 NOTE — ED Provider Notes (Signed)
CSN: [id number]     Arrival date & time 12/28/14  1478 History  This chart was scribed for Bethann Berkshire, MD by Placido Sou, ED scribe. This patient was seen in room APA08/APA08 and the patient's care was started at 9:54 AM.    Chief Complaint  Patient presents with  . multiple complaints    Patient is a 34 y.o. female presenting with cough. The history is provided by the patient. No language interpreter was used.  Cough Cough characteristics:  Productive Sputum characteristics:  Nondescript Severity:  Moderate Onset quality:  Gradual Timing:  Intermittent Progression:  Waxing and waning Chronicity:  Recurrent Smoker: yes   Relieved by:  Nothing Associated symptoms: chest pain, ear pain and wheezing   Associated symptoms: no eye discharge     HPI Comments: SHAWNDA MAUNEY is a 34 y.o. female who presents to the Emergency Department complaining of moderate, intermittent, cough with onset 7 months ago. Pt notes associated ear pain, congestion and CP. She notes having multiple episodes including these symptoms and going to the clinic for her last occurrence and received a z-pak which provided no relief of her symptoms. She denies any current health coverage.   Past Medical History  Diagnosis Date  . Seizures   . Bipolar 1 disorder   . Anxiety   . Hepatitis C    Past Surgical History  Procedure Laterality Date  . Tubal ligation    . Tonsillectomy     Family History  Problem Relation Age of Onset  . Fibromyalgia Mother   . Asthma Mother   . Heart failure Mother   . Heart failure Father   . Cancer Father   . Heart failure Other    History  Substance Use Topics  . Smoking status: Current Every Day Smoker -- 0.50 packs/day    Types: Cigarettes  . Smokeless tobacco: Never Used  . Alcohol Use: 0.6 oz/week    1 Cans of beer per week     Comment: daily  Pt denies 7/7   OB History    Gravida Para Term Preterm AB TAB SAB Ectopic Multiple Living   Review  of Systems  Constitutional: Negative for appetite change and fatigue.  HENT: Positive for ear pain and voice change. Negative for sinus pressure.   Eyes: Negative for discharge.  Respiratory: Positive for cough and wheezing.   Cardiovascular: Positive for chest pain.  Genitourinary: Negative for frequency and hematuria.  Musculoskeletal: Negative for back pain.  Neurological: Negative for seizures.  Psychiatric/Behavioral: Negative for hallucinations.      Allergies  Review of patient's allergies indicates no known allergies.  Home Medications   Prior to Admission medications   Medication Sig Start Date End Date Taking? Authorizing Provider  clindamycin (CLEOCIN) 300 MG capsule Take 1 capsule (300 mg total) by mouth 4 (four) times daily. X 7 days 11/04/14   Mancel Bale, MD  diphenhydramine-acetaminophen (TYLENOL PM) 25-500 MG TABS Take 1 tablet by mouth once as needed (for pain/sleep).    Historical Provider, MD  Pseudoephedrine-APAP-DM (DAYQUIL MULTI-SYMPTOM COLD/FLU PO) Take 10-15 mLs by mouth once as needed (FOR COUGH).    Historical Provider, MD   BP 146/91 mmHg  Pulse 71  Temp(Src) 98.2 F (36.8 C) (Oral)  Resp 18  Ht  (1.651 m)  Wt 165 lb (74.844 kg)  BMI 27.46 kg/m2  SpO2 96%  LMP 12/28/2014 Physical Exam  Constitutional:  She is oriented to person, place, and time. She appears well-developed.  HENT:  Head: Normocephalic.  Eyes: Conjunctivae are normal.  Neck: No tracheal deviation present.  Cardiovascular:  No murmur heard. Pulmonary/Chest: She has wheezes.  Moderate wheezing bilaterally  Musculoskeletal: Normal range of motion.  Neurological: She is oriented to person, place, and time.  Skin: Skin is warm.  Psychiatric: She has a normal mood and affect.    ED Course  Procedures  DIAGNOSTIC STUDIES: Oxygen Saturation is 96% on RA, normal by my interpretation.    COORDINATION OF CARE: 9:57 AM Discussed treatment plan with pt at bedside and pt  agreed to plan.  Labs Review Labs Reviewed - No data to display  Imaging Review No results found.   EKG Interpretation None      MDM   Final diagnoses:  None    Pt with cough and wheezing,  Improved with neb tx.  Neg cxr,  Pt just finished zpak,  Will rx prednisone and albuterol hfa to tx for bronchospasm.  Pt will follow up if not improving.   Bethann BerkshireJoseph Roldan Laforest, MD 12/28/14 1250

## 2014-12-28 NOTE — Discharge Instructions (Signed)
Follow up next week if not improving. °

## 2015-03-07 ENCOUNTER — Emergency Department (HOSPITAL_COMMUNITY)
Admission: EM | Admit: 2015-03-07 | Discharge: 2015-03-07 | Disposition: A | Discharge status: Home / Self Care | Payer: Self-pay | Attending: Emergency Medicine | Admitting: Emergency Medicine

## 2015-03-07 ENCOUNTER — Encounter (HOSPITAL_COMMUNITY): Payer: Self-pay | Admitting: *Deleted

## 2015-03-07 DIAGNOSIS — L03314 Cellulitis of groin: Secondary | ICD-10-CM | POA: Insufficient documentation

## 2015-03-07 DIAGNOSIS — Z720 Tobacco use: Secondary | ICD-10-CM | POA: Insufficient documentation

## 2015-03-07 DIAGNOSIS — Z8659 Personal history of other mental and behavioral disorders: Secondary | ICD-10-CM | POA: Insufficient documentation

## 2015-03-07 DIAGNOSIS — Z8619 Personal history of other infectious and parasitic diseases: Secondary | ICD-10-CM | POA: Insufficient documentation

## 2015-03-07 MED ORDER — OXYCODONE-ACETAMINOPHEN 5-325 MG PO TABS
2.0000 | ORAL_TABLET | Freq: Once | ORAL | Status: AC
  Administered 2015-03-07: 2 via ORAL
  Filled 2015-03-07: qty 2

## 2015-03-07 MED ORDER — DOXYCYCLINE HYCLATE 100 MG PO CAPS: 100.0000 mg | ORAL_CAPSULE | Freq: Two times a day (BID) | ORAL | Status: DC

## 2015-03-07 MED ORDER — IBUPROFEN 800 MG PO TABS
800.0000 mg | ORAL_TABLET | Freq: Once | ORAL | Status: AC
  Administered 2015-03-07: 800 mg via ORAL
  Filled 2015-03-07: qty 1

## 2015-03-07 MED ORDER — DOXYCYCLINE HYCLATE 100 MG PO TABS
100.0000 mg | ORAL_TABLET | Freq: Once | ORAL | Status: AC
  Administered 2015-03-07: 100 mg via ORAL
  Filled 2015-03-07: qty 1

## 2015-03-07 NOTE — ED Notes (Signed)
Pt c/o abscess to pubic area that started yesterday morning. Pt reports the red area has gotten increasingly worse since yesterday. Pt has redness to lower abdomen right at top of pubic area with small black center. Very tender to palpation. Pain rated 8/10.

## 2015-03-07 NOTE — ED Provider Notes (Signed)
CSN: [id number]     Arrival date & time 03/07/15  1609 History   First MD Initiated Contact with Patient 03/07/15 1626     Chief Complaint  Patient presents with  . Abscess     Patient is a 34 y.o. female presenting with abscess. The history is provided by the patient.  Abscess Location:  Ano-genital Ano-genital abscess location:  Groin Abscess quality: induration, painful and redness   Abscess quality: not draining   Duration:  1 day Progression:  Worsening Pain details:    Severity:  Moderate   Timing:  Constant   Progression:  Worsening Chronicity:  New Relieved by:  Nothing Associated symptoms: no fever     Past Medical History  Diagnosis Date  . Seizures   . Bipolar 1 disorder   . Anxiety   . Hepatitis C    Past Surgical History  Procedure Laterality Date  . Tubal ligation    . Tonsillectomy     Family History  Problem Relation Age of Onset  . Fibromyalgia Mother   . Asthma Mother   . Heart failure Mother   . Heart failure Father   . Cancer Father   . Heart failure Other    Social History  Substance Use Topics  . Smoking status: Current Every Day Smoker -- 0.50 packs/day    Types: Cigarettes  . Smokeless tobacco: Never Used  . Alcohol Use: 0.6 oz/week    1 Cans of beer per week     Comment: daily  Pt denies 7/7   OB History    Gravida Para Term Preterm AB TAB SAB Ectopic Multiple Living   2 2  2            Review of Systems  Constitutional: Negative for fever.  Skin: Positive for wound.      Allergies  Review of patient's allergies indicates no known allergies.  Home Medications   Prior to Admission medications   Medication Sig Start Date End Date Taking? Authorizing Provider  ibuprofen (ADVIL,MOTRIN) 200 MG tablet Take 400 mg by mouth every 6 (six) hours as needed for mild pain or moderate pain.   Yes Historical Provider, MD  doxycycline (VIBRAMYCIN) 100 MG capsule Take 1 capsule (100 mg total) by mouth 2 (two) times daily. One po bid x  7 days 03/07/15   Zadie Rhine, MD   BP 115/82 mmHg  Pulse 85  Temp(Src) 97.2 F (36.2 C) (Axillary)  Resp 18  Ht 5\' 5"  (1.651 m)  Wt 171 lb 9 oz (77.82 kg)  BMI 28.55 kg/m2  SpO2 96%  LMP 02/28/2015 Physical Exam CONSTITUTIONAL: Well developed/well nourished HEAD: Normocephalic/atraumatic EYES: EOMI ENMT: Mucous membranes moist NECK: supple no meningeal signs CV: S1/S2 noted, no murmurs/rubs/gallops noted ABDOMEN: soft, nontender [id number] of erythema and induration to pubic region.  It is well demarcated.  No streaking.  No crepitus or drainage.  No fluctuance.  Tenderness to palpation noted NEURO: Pt is awake/alert/appropriate, moves all extremitiesx4.  No facial droop.   EXTREMITIES: full ROM SKIN: warm, color normal PSYCH: no abnormalities of mood noted, alert and oriented to situation  ED Course  Procedures   Early cellulitis Start doxycycline Discussed return precautions Medications  oxyCODONE-acetaminophen (PERCOCET/ROXICET) 5-325 MG per tablet 2 tablet (2 tablets Oral Given 03/07/15 1649)  ibuprofen (ADVIL,MOTRIN) tablet 800 mg (800 mg Oral Given 03/07/15 1648)  doxycycline (VIBRA-TABS) tablet 100 mg (100 mg Oral Given 03/07/15 1719)     MDM   Final diagnoses:  Cellulitis  of groin    Nursing notes including past medical history and social history reviewed and considered in documentation     Zadie Rhine, MD 03/07/15 1730

## 2015-03-07 NOTE — ED Notes (Signed)
Pt with abscess to pubic area since yesterday, recent dentures and not able to eat and drink much

## 2015-03-07 NOTE — Discharge Instructions (Signed)

## 2015-03-07 NOTE — ED Notes (Signed)
MD at bedside. 

## 2015-04-03 ENCOUNTER — Encounter (INDEPENDENT_AMBULATORY_CARE_PROVIDER_SITE_OTHER): Payer: Self-pay | Admitting: *Deleted

## 2015-04-06 ENCOUNTER — Encounter (HOSPITAL_COMMUNITY): Payer: Self-pay | Admitting: Emergency Medicine

## 2015-04-06 ENCOUNTER — Emergency Department (HOSPITAL_COMMUNITY)
Admission: EM | Admit: 2015-04-06 | Discharge: 2015-04-06 | Disposition: A | Discharge status: Home / Self Care | Payer: Self-pay | Attending: Emergency Medicine | Admitting: Emergency Medicine

## 2015-04-06 ENCOUNTER — Emergency Department (HOSPITAL_COMMUNITY): Payer: Self-pay

## 2015-04-06 DIAGNOSIS — Y9289 Other specified places as the place of occurrence of the external cause: Secondary | ICD-10-CM | POA: Insufficient documentation

## 2015-04-06 DIAGNOSIS — Y998 Other external cause status: Secondary | ICD-10-CM | POA: Insufficient documentation

## 2015-04-06 DIAGNOSIS — Z8619 Personal history of other infectious and parasitic diseases: Secondary | ICD-10-CM | POA: Insufficient documentation

## 2015-04-06 DIAGNOSIS — Z720 Tobacco use: Secondary | ICD-10-CM | POA: Insufficient documentation

## 2015-04-06 DIAGNOSIS — Z8659 Personal history of other mental and behavioral disorders: Secondary | ICD-10-CM | POA: Insufficient documentation

## 2015-04-06 DIAGNOSIS — Y9389 Activity, other specified: Secondary | ICD-10-CM | POA: Insufficient documentation

## 2015-04-06 DIAGNOSIS — X58XXXA Exposure to other specified factors, initial encounter: Secondary | ICD-10-CM | POA: Insufficient documentation

## 2015-04-06 DIAGNOSIS — S82841A Displaced bimalleolar fracture of right lower leg, initial encounter for closed fracture: Secondary | ICD-10-CM | POA: Insufficient documentation

## 2015-04-06 MED ORDER — HYDROCODONE-ACETAMINOPHEN 5-325 MG PO TABS: 1.0000 | ORAL_TABLET | Freq: Four times a day (QID) | ORAL | Status: DC | PRN

## 2015-04-06 MED ORDER — HYDROMORPHONE HCL 1 MG/ML IJ SOLN
1.0000 mg | Freq: Once | INTRAMUSCULAR | Status: AC
Start: 2015-04-06 — End: 2015-04-06
  Administered 2015-04-06: 1 mg via INTRAMUSCULAR
  Filled 2015-04-06: qty 1

## 2015-04-06 NOTE — Discharge Instructions (Signed)
It was our pleasure to provide your ER care today - we hope that you feel better.  You must keep your right ankle elevated at all times - this will greatly help with your pain and swelling.  Keep your foot elevated above the level of your chest/heart, at all times.  Apply ice/coldpacks to sore area.   Keep splint clean and dry. Use crutches.  No weight bearing on your right leg or foot.   You must follow up with orthopedist this coming week - see referral - call their office Monday morning to arrange appointment.   Take motrin or aleve as need for pain. You may also take hydrocodone as need for pain. No driving for the next 6 hours, or when taking hydrocodone. Also, do not take tylenol or acetaminophen containing medication when taking hydrocodone.  Return to ER if worse, new symptoms, worsening or intractable pain, numbness/weakness, other concern.     Ankle Fracture A fracture is a break in a bone. The ankle joint is made up of three bones. These include the lower (distal)sections of your lower leg bones, called the tibia and fibula, along with a bone in your foot, called the talus. Depending on how bad the break is and if more than one ankle joint bone is broken, a cast or splint is used to protect and keep your injured bone from moving while it heals. Sometimes, surgery is required to help the fracture heal properly.  There are two general types of fractures:  Stable fracture. This includes a single fracture line through one bone, with no injury to ankle ligaments. A fracture of the talus that does not have any displacement (movement of the bone on either side of the fracture line) is also stable.  Unstable fracture. This includes more than one fracture line through one or more bones in the ankle joint. It also includes fractures that have displacement of the bone on either side of the fracture line. CAUSES  A direct blow to the ankle.   Quickly and severely twisting your  ankle.  Trauma, such as a car accident or falling from a significant height. RISK FACTORS You may be at a higher risk of ankle fracture if:  You have certain medical conditions.  You are involved in high-impact sports.  You are involved in a high-impact car accident. SIGNS AND SYMPTOMS  1. Tender and swollen ankle. 2. Bruising around the injured ankle. 3. Pain on movement of the ankle. 4. Difficulty walking or putting weight on the ankle. 5. A cold foot below the site of the ankle injury. This can occur if the blood vessels passing through your injured ankle were also damaged. 6. Numbness in the foot below the site of the ankle injury. DIAGNOSIS  An ankle fracture is usually diagnosed with a physical exam and X-rays. A CT scan may also be required for complex fractures. TREATMENT  Stable fractures are treated with a cast or splint and using crutches to avoid putting weight on your injured ankle. This is followed by an ankle strengthening program. Some patients require a special type of cast, depending on other medical problems they may have. Unstable fractures require surgery to ensure the bones heal properly. Your health care provider will tell you what type of fracture you have and the best treatment for your condition. HOME CARE INSTRUCTIONS  1. Review correct crutch use with your health care provider and use your crutches as directed. Safe use of crutches is extremely important. Misuse of  crutches can cause you to fall or cause injury to nerves in your hands or armpits. 2. Do not put weight or pressure on the injured ankle until directed by your health care provider. 3. To lessen the swelling, keep the injured leg elevated while sitting or lying down. 4. Apply ice to the injured area: 1. Put ice in a plastic bag. 2. Place a towel between your cast and the bag. 3. Leave the ice on for 20 minutes, 2-3 times a day. 5. If you have a plaster or fiberglass cast: 1. Do not try to scratch  the skin under the cast with any objects. This can increase your risk of skin infection. 2. Check the skin around the cast every day. You may put lotion on any red or sore areas. 3. Keep your cast dry and clean. 6. If you have a plaster splint: 1. Wear the splint as directed. 2. You may loosen the elastic around the splint if your toes become numb, tingle, or turn cold or blue. 7. Do not put pressure on any part of your cast or splint; it may break. Rest your cast only on a pillow the first 24 hours until it is fully hardened. 8. Your cast or splint can be protected during bathing with a plastic bag sealed to your skin with medical tape. Do not lower the cast or splint into water. 9. Take medicines as directed by your health care provider. Only take over-the-counter or prescription medicines for pain, discomfort, or fever as directed by your health care provider. 10. Do not drive a vehicle until your health care provider specifically tells you it is safe to do so. 11. If your health care provider has given you a follow-up appointment, it is very important to keep that appointment. Not keeping the appointment could result in a chronic or permanent injury, pain, and disability. If you have any problem keeping the appointment, call the facility for assistance. SEEK MEDICAL CARE IF: You develop increased swelling or discomfort. SEEK IMMEDIATE MEDICAL CARE IF:  1. Your cast gets damaged or breaks. 2. You have continued severe pain. 3. You develop new pain or swelling after the cast was put on. 4. Your skin or toenails below the injury turn blue or gray. 5. Your skin or toenails below the injury feel cold, numb, or have loss of sensitivity to touch. 6. There is a bad smell or pus draining from under the cast. MAKE SURE YOU:  1. Understand these instructions. 2. Will watch your condition. 3. Will get help right away if you are not doing well or get worse.   This information is not intended to  replace advice given to you by your health care provider. Make sure you discuss any questions you have with your health care provider.    Cryotherapy Cryotherapy means treatment with cold. Ice or gel packs can be used to reduce both pain and swelling. Ice is the most helpful within the first 24 to 48 hours after an injury or flare-up from overusing a muscle or joint. Sprains, strains, spasms, burning pain, shooting pain, and aches can all be eased with ice. Ice can also be used when recovering from surgery. Ice is effective, has very few side effects, and is safe for most people to use. PRECAUTIONS  Ice is not a safe treatment option for people with:  Raynaud phenomenon. This is a condition affecting small blood vessels in the extremities. Exposure to cold may cause your problems to return.  Cold hypersensitivity. There are many forms of cold hypersensitivity, including:  Cold urticaria. Red, itchy hives appear on the skin when the tissues begin to warm after being iced.  Cold erythema. This is a red, itchy rash caused by exposure to cold.  Cold hemoglobinuria. Red blood cells break down when the tissues begin to warm after being iced. The hemoglobin that carry oxygen are passed into the urine because they cannot combine with blood proteins fast enough.  Numbness or altered sensitivity in the area being iced. If you have any of the following conditions, do not use ice until you have discussed cryotherapy with your caregiver:  Heart conditions, such as arrhythmia, angina, or chronic heart disease.  High blood pressure.  Healing wounds or open skin in the area being iced.  Current infections.  Rheumatoid arthritis.  Poor circulation.  Diabetes. Ice slows the blood flow in the region it is applied. This is beneficial when trying to stop inflamed tissues from spreading irritating chemicals to surrounding tissues. However, if you expose your skin to cold temperatures for too long or  without the proper protection, you can damage your skin or nerves. Watch for signs of skin damage due to cold. HOME CARE INSTRUCTIONS Follow these tips to use ice and cold packs safely.  Place a dry or damp towel between the ice and skin. A damp towel will cool the skin more quickly, so you may need to shorten the time that the ice is used.  For a more rapid response, add gentle compression to the ice.  Ice for no more than 10 to 20 minutes at a time. The bonier the area you are icing, the less time it will take to get the benefits of ice.  Check your skin after 5 minutes to make sure there are no signs of a poor response to cold or skin damage.  Rest 20 minutes or more between uses.  Once your skin is numb, you can end your treatment. You can test numbness by very lightly touching your skin. The touch should be so light that you do not see the skin dimple from the pressure of your fingertip. When using ice, most people will feel these normal sensations in this order: cold, burning, aching, and numbness.  Do not use ice on someone who cannot communicate their responses to pain, such as small children or people with dementia. HOW TO MAKE AN ICE PACK Ice packs are the most common way to use ice therapy. Other methods include ice massage, ice baths, and cryosprays. Muscle creams that cause a cold, tingly feeling do not offer the same benefits that ice offers and should not be used as a substitute unless recommended by your caregiver. To make an ice pack, do one of the following: 7. Place crushed ice or a bag of frozen vegetables in a sealable plastic bag. Squeeze out the excess air. Place this bag inside another plastic bag. Slide the bag into a pillowcase or place a damp towel between your skin and the bag. 8. Mix 3 parts water with 1 part rubbing alcohol. Freeze the mixture in a sealable plastic bag. When you remove the mixture from the freezer, it will be slushy. Squeeze out the excess air.  Place this bag inside another plastic bag. Slide the bag into a pillowcase or place a damp towel between your skin and the bag. SEEK MEDICAL CARE IF: 12. You develop white spots on your skin. This may give the skin a blotchy (mottled)  appearance. 13. Your skin turns blue or pale. 14. Your skin becomes waxy or hard. 15. Your swelling gets worse. MAKE SURE YOU:  7. Understand these instructions. 8. Will watch your condition. 9. Will get help right away if you are not doing well or get worse.   This information is not intended to replace advice given to you by your health care provider. Make sure you discuss any questions you have with your health care provider.   Document Released: 02/03/2011 Document Revised: 06/30/2014 Document Reviewed: 02/03/2011 Elsevier Interactive Patient Education 2016 [employer].   Crutch Use Crutches are used to take weight off one of your legs or feet when you stand or walk. It is important to use crutches that fit properly. When fitted properly:  Each crutch should be 2-3 finger widths below the armpit.  Your weight should be supported by your hand, and not by resting the armpit on the crutch. RISKS AND COMPLICATIONS Damage to the nerves that extend from your armpit to your hand and arm. To prevent this from happening, make sure your crutches fit properly and do not put pressure on your armpit when using them. HOW TO USE YOUR CRUTCHES If you have been instructed to use partial weight bearing, apply (bear) the amount of weight as your health care provider suggests. Do not bear weight in an amount that causes pain to the area of injury. Walking  Step with the crutches.  Swing the healthy leg slightly ahead of the crutches. Going Up Steps If there is no handrail:  Step up with the healthy leg.  Step up with the crutches and injured leg.  Continue in this way. If there is a handrail: 9. Hold both crutches in one hand. 10. Place your free hand on the  handrail. 11. While putting your weight on your arms, lift your healthy leg to the step. 12. Bring the crutches and the injured leg up to that step. 13. Continue in this way. Going Down Steps Be very careful, as going down stairs with crutches is very challenging. If there is no handrail: 16. Step down with the injured leg and crutches. 17. Step down with the healthy leg. If there is a handrail: 10. Place your hand on the handrail. 11. Hold both crutches with your free hand. 12. Lower your injured leg and crutch to the step below you. Make sure to keep the crutch tips in the center of the step, never on the edge. 13. Lower your healthy leg to that step. 14. Continue in this way. Standing Up 4. Hold the injured leg forward. 5. Grab the armrest with one hand and the top of the crutches with the other hand. 6. Using these supports, pull yourself up to a standing position. Sitting Down 1. Hold the injured leg forward. 2. Grab the armrest with one hand and the top of the crutches with the other hand. 3. Lower yourself to a sitting position. SEEK MEDICAL CARE IF:  You still feel unsteady on your feet.  You develop new pain, for example in your armpits, back, shoulder, wrist, or hip.  You develop any numbness or tingling. SEEK IMMEDIATE MEDICAL CARE IF:  You fall.   This information is not intended to replace advice given to you by your health care provider. Make sure you discuss any questions you have with your health care provider.   Document Released: 06/06/2000 Document Revised: 06/30/2014 Document Reviewed: 02/14/2013 Elsevier Interactive Patient Education 2016 [employer].  Cast or Splint  Care Casts and splints support injured limbs and keep bones from moving while they heal. It is important to care for your cast or splint at home.  HOME CARE INSTRUCTIONS  Keep the cast or splint uncovered during the drying period. It can take 24 to 48 hours to dry if it is made of  plaster. A fiberglass cast will dry in less than 1 hour.  Do not rest the cast on anything harder than a pillow for the first 24 hours.  Do not put weight on your injured limb or apply pressure to the cast until your health care provider gives you permission.  Keep the cast or splint dry. Wet casts or splints can lose their shape and may not support the limb as well. A wet cast that has lost its shape can also create harmful pressure on your skin when it dries. Also, wet skin can become infected.  Cover the cast or splint with a plastic bag when bathing or when out in the rain or snow. If the cast is on the trunk of the body, take sponge baths until the cast is removed.  If your cast does become wet, dry it with a towel or a blow dryer on the cool setting only.  Keep your cast or splint clean. Soiled casts may be wiped with a moistened cloth.  Do not place any hard or soft foreign objects under your cast or splint, such as cotton, toilet paper, lotion, or powder.  Do not try to scratch the skin under the cast with any object. The object could get stuck inside the cast. Also, scratching could lead to an infection. If itching is a problem, use a blow dryer on a cool setting to relieve discomfort.  Do not trim or cut your cast or remove padding from inside of it.  Exercise all joints next to the injury that are not immobilized by the cast or splint. For example, if you have a long leg cast, exercise the hip joint and toes. If you have an arm cast or splint, exercise the shoulder, elbow, thumb, and fingers.  Elevate your injured arm or leg on 1 or 2 pillows for the first 1 to 3 days to decrease swelling and pain.It is best if you can comfortably elevate your cast so it is higher than your heart. SEEK MEDICAL CARE IF:   Your cast or splint cracks.  Your cast or splint is too tight or too loose.  You have unbearable itching inside the cast.  Your cast becomes wet or develops a soft spot or  area.  You have a bad smell coming from inside your cast.  You get an object stuck under your cast.  Your skin around the cast becomes red or raw.  You have new pain or worsening pain after the cast has been applied. SEEK IMMEDIATE MEDICAL CARE IF:   You have fluid leaking through the cast.  You are unable to move your fingers or toes.  You have discolored (blue or white), cool, painful, or very swollen fingers or toes beyond the cast.  You have tingling or numbness around the injured area.  You have severe pain or pressure under the cast.  You have any difficulty with your breathing or have shortness of breath.  You have chest pain.   This information is not intended to replace advice given to you by your health care provider. Make sure you discuss any questions you have with your health care provider.  Document Released: 06/06/2000 Document Revised: 03/30/2013 Document Reviewed: 12/16/2012 Elsevier Interactive Patient Education [employer]2016 Elsevier Inc.

## 2015-04-06 NOTE — ED Notes (Signed)
Stirrup added to splint. CSM remains intact

## 2015-04-06 NOTE — ED Notes (Signed)
Pt states that she was standing and her ankle twisted and now is swollen and painful.

## 2015-04-06 NOTE — ED Provider Notes (Signed)
CSN: [id number]     Arrival date & time 04/06/15  1549 History   First MD Initiated Contact with Patient 04/06/15 1706     Chief Complaint  Patient presents with  . Ankle Pain     (Consider location/radiation/quality/duration/timing/severity/associated sxs/prior Treatment) Patient is a 34 y.o. female presenting with ankle pain. The history is provided by the patient.  Ankle Pain Associated symptoms: no back pain, no fever and no neck pain   Patient indicates today she twisted right ankle, stepping on awkwardly, and had sudden onset pain right ankle. Pain mod-severe, constant, worse w movement. Unable to stand/bear wt due to pain. Skin is intact. Denies other pain or injury. No head injury or headache. No neck or back pain. No knee or upper tib/fib area pain.       Past Medical History  Diagnosis Date  . Seizures (HCC)   . Bipolar 1 disorder (HCC)   . Anxiety   . Hepatitis C    Past Surgical History  Procedure Laterality Date  . Tubal ligation    . Tonsillectomy     Family History  Problem Relation Age of Onset  . Fibromyalgia Mother   . Asthma Mother   . Heart failure Mother   . Heart failure Father   . Cancer Father   . Heart failure Other    Social History  Substance Use Topics  . Smoking status: Current Every Day Smoker -- 0.50 packs/day    Types: Cigarettes  . Smokeless tobacco: Never Used  . Alcohol Use: 0.6 oz/week    1 Cans of beer per week     Comment: daily  Pt denies 7/7   OB History    Gravida Para Term Preterm AB TAB SAB Ectopic Multiple Living   2 2  2            Review of Systems  Constitutional: Negative for fever.  Musculoskeletal: Negative for back pain and neck pain.  Skin: Negative for wound.  Neurological: Negative for weakness, numbness and headaches.      Allergies  Review of patient's allergies indicates no known allergies.  Home Medications   Prior to Admission medications   Medication Sig Start Date End Date Taking?  Authorizing Provider  ibuprofen (ADVIL,MOTRIN) 200 MG tablet Take 400 mg by mouth every 6 (six) hours as needed for mild pain or moderate pain.   Yes Historical Provider, MD  doxycycline (VIBRAMYCIN) 100 MG capsule Take 1 capsule (100 mg total) by mouth 2 (two) times daily. One po bid x 7 days Patient not taking: Reported on 04/06/2015 03/07/15   Zadie Rhineonald Wickline, MD   BP 115/83 mmHg  Pulse 107  Temp(Src) 97.8 F (36.6 C) (Oral)  Resp 18  Ht 5\' 5"  (1.651 m)  Wt 173 lb (78.472 kg)  BMI 28.79 kg/m2  SpO2 98%  LMP 03/26/2015 Physical Exam  Constitutional: She appears well-developed and well-nourished. No distress.  HENT:  Head: Atraumatic.  Eyes: Conjunctivae are normal. No scleral icterus.  Neck: Normal range of motion. Neck supple. No tracheal deviation present.  Cardiovascular: Normal rate and intact distal pulses.   Pulmonary/Chest: Effort normal. No respiratory distress.  Abdominal: Normal appearance. She exhibits no distension. There is no tenderness.  Musculoskeletal:  sts and tenderness med and lat malleolus right ankle. Dp/pt 2+, normal cap refill distally in toes. No right knee or prox tib/fib pain or tenderness. No other focal bony tenderness on bil ext exam.   Neurological: She is alert.  Motor intact  right foot, sens grossly intact.   Skin: Skin is warm and dry. No rash noted. She is not diaphoretic.  Intact, no wounds.   Psychiatric: She has a normal mood and affect.  Nursing note and vitals reviewed.   ED Course  Procedures (including critical care time)  Imaging Review Dg Ankle Complete Right  04/06/2015  CLINICAL DATA:  Right ankle pain, swelling EXAM: RIGHT ANKLE - COMPLETE 3+ VIEW COMPARISON:  None. FINDINGS: Oblique distal fibular fracture, minimally displaced. Transverse lateral malleolar fracture, minimally displaced. Ankle mortise is preserved. Moderate lateral soft tissue swelling. The base of the fifth metatarsal is unremarkable. IMPRESSION: Bimalleolar  fracture, as above. Electronically Signed   By: Charline Bills M.D.   On: 04/06/2015 16:17   I have personally reviewed and evaluated these images and lab results as part of my medical decision-making.   MDM   Iv ns. Elevate. Ice. Splint.  Xrays.  Dilaudid 1 mg im.   xrays w bimalleolar fracture.  No orthopedist on call at [facility]-  Will contact [facility] orthopedist on call.  Discussed pt with Dr Veda Canning, he requests post splint w stirrups, nwb, crutches, elevate, and follow up with him in office next week, to have pt call office Monday.  Recheck pt, comfortable, pain controlled. No numbness/weakness. Skin intact.  Post splint. Normal cap refill distally, dp 2+. Pain controlled.   Pt currently appears stable for d/c.        Cathren Laine, MD 04/06/15 1836

## 2015-04-06 NOTE — ED Notes (Signed)
Went into room to assess pt. Pt would not speak to me because she was upset because she was having trouble with her cell phone. Went back into room 10 minutes later and elevated right foot and applied ice to right ankle.

## 2015-04-09 ENCOUNTER — Encounter (HOSPITAL_COMMUNITY): Payer: Self-pay | Admitting: Emergency Medicine

## 2015-04-09 ENCOUNTER — Emergency Department (HOSPITAL_COMMUNITY)
Admission: EM | Admit: 2015-04-09 | Discharge: 2015-04-09 | Discharge status: Against Medical Advice | Payer: Self-pay | Attending: Emergency Medicine | Admitting: Emergency Medicine

## 2015-04-09 ENCOUNTER — Telehealth: Payer: Self-pay | Admitting: Physician Assistant

## 2015-04-09 DIAGNOSIS — Z720 Tobacco use: Secondary | ICD-10-CM | POA: Insufficient documentation

## 2015-04-09 DIAGNOSIS — X58XXXD Exposure to other specified factors, subsequent encounter: Secondary | ICD-10-CM | POA: Insufficient documentation

## 2015-04-09 DIAGNOSIS — S82841D Displaced bimalleolar fracture of right lower leg, subsequent encounter for closed fracture with routine healing: Secondary | ICD-10-CM

## 2015-04-09 DIAGNOSIS — S99911D Unspecified injury of right ankle, subsequent encounter: Secondary | ICD-10-CM | POA: Insufficient documentation

## 2015-04-09 NOTE — ED Notes (Signed)
Pt was seen here last week for a RT ankle fracture last Friday. Pt states she has been unable to see an orthopedic doctor for follow-up at this time. Pt states the pain medication has not been giving her any relief.

## 2015-04-09 NOTE — Telephone Encounter (Signed)
Pt. Called in stated she went to ER this weekend with a broken ankle. Pt. Was given a referral by the hospital but doctors office stated they were not sure if they would see her since she didn't have insurance. Pt. Would need money up front. Pt. Was told to fill out [facility] and [facility] and see if she could get an appointment there.

## 2015-04-09 NOTE — ED Notes (Signed)
Contacted [facility] in attempt to arrange a follow-up appointment. [facility] after MD gets out of surgery to arrange appointment.

## 2015-04-09 NOTE — Telephone Encounter (Signed)
Got phone call- see note

## 2015-04-12 ENCOUNTER — Encounter (INDEPENDENT_AMBULATORY_CARE_PROVIDER_SITE_OTHER): Payer: Self-pay | Admitting: Internal Medicine

## 2015-04-12 ENCOUNTER — Encounter (INDEPENDENT_AMBULATORY_CARE_PROVIDER_SITE_OTHER): Payer: Self-pay | Admitting: *Deleted

## 2015-04-12 ENCOUNTER — Ambulatory Visit (INDEPENDENT_AMBULATORY_CARE_PROVIDER_SITE_OTHER): Payer: Self-pay | Admitting: Internal Medicine

## 2015-04-12 VITALS — BP 124/82 | HR 76 | Temp 98.3°F | Ht 66.0 in | Wt 176.2 lb

## 2015-04-12 DIAGNOSIS — B1920 Unspecified viral hepatitis C without hepatic coma: Secondary | ICD-10-CM

## 2015-04-12 NOTE — Patient Instructions (Addendum)
Labs today. Brochure given to patient.  Advised this process will take several weeks. OV 3 months. Advised to [facility] and get Hepatitis a B vaccine. You must stop drinking.

## 2015-04-12 NOTE — Progress Notes (Addendum)
   Subjective:    Patient ID: [name], female    DOB: 05/04/1981, 34 y.o.   MRN: [id number]  HPI Present today for evaluation of Hepatitis C. Referred to [facility], Jacquelin HawkingShannon McElroy PA.  She admits to drinking presently, and I have advised her that she must stop drink. Her appetite is good. No weight loss. No abdominal pain. BM every morning. No melena or BRRB. Presently has an ankle fx that is going to require surgery at [facility] 04/17/[facility]. Received DUI 4 yrs ago and does not drive   Have you ever been treated for Hepatitis C?  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 She has done IV drugs Are you drinking now? yes Any hx of etoh abuse? Yes. Drinks one beer during the week and maybe a 6 pack on the weekend. Do you have tattoos? 6 Have you ever received a blood transfusion? None. When were you diagnosed with Hepatitis C? 1 1/2 yrs ago. Any hx of mental illness requiring treatment? none Do you have suicidal thoughts? none     07/21/2013 He A antibody IgM non reactive Hepatitis B surface antibody negative Hepatitis C antibody reactive. Hep C RNA quaint [id number] Genotype 1     CBC    Component Value Date/Time   WBC 11.0* 12/28/2014 1045   RBC 4.29 12/28/2014 1045   HGB 15.2* 12/28/2014 1045   HCT 43.3 12/28/2014 1045   PLT 153 12/28/2014 1045   MCV 100.9* 12/28/2014 1045   MCH 35.4* 12/28/2014 1045   MCHC 35.1 12/28/2014 1045   RDW 12.4 12/28/2014 1045   LYMPHSABS 1.9 12/28/2014 1045   MONOABS 1.1* 12/28/2014 1045   EOSABS 0.1 12/28/2014 1045   BASOSABS 0.0 12/28/2014 1045    Hepatic Function Panel     Component Value Date/Time   PROT 7.7 12/28/2014 1014   ALBUMIN 4.3 12/28/2014 1014   AST 110* 12/28/2014 1014   ALT 95* 12/28/2014 1014   ALKPHOS 66 12/28/2014 1014   BILITOT 1.0 12/28/2014 1014   Drugs of Abuse     Component Value Date/Time   LABOPIA NONE DETECTED 12/04/2013 1745   COCAINSCRNUR NONE DETECTED 12/04/2013 1745   LABBENZ NONE DETECTED 12/04/2013 1745   AMPHETMU NONE DETECTED 12/04/2013 1745   THCU POSITIVE* 12/04/2013 1745   LABBARB NONE DETECTED 12/04/2013 1745        Review of Systems Alert and oriented. Skin warm and dry. Oral mucosa is moist.   . Sclera anicteric, conjunctivae is pink. Thyroid not enlarged. No cervical lymphadenopathy. Lungs clear. Heart regular rate and rhythm.  Abdomen is soft. Bowel sounds are positive. No hepatomegaly. No abdominal masses felt. No tenderness.  No edema to lower extremities.       Objective:   Physical Exam Blood pressure 124/82, pulse 76, temperature 98.3 F (36.8 C), height 5\' 6"  (1.676 m), weight 176 lb 3.2 oz (79.924 kg), last menstrual period 03/26/2015. Alert and oriented. Skin warm and dry. Oral mucosa is moist.   . Sclera anicteric, conjunctivae is pink. Thyroid not enlarged. No cervical lymphadenopathy. Lungs clear. Heart regular rate and rhythm.  Abdomen is soft. Bowel sounds are positive. No hepatomegaly. No abdominal masses felt. No tenderness.  No edema to lower extremities.          Assessment & Plan:  Hepatitis C. Will get basic labs. Further recommendations to follow.

## 2015-04-20 ENCOUNTER — Encounter (INDEPENDENT_AMBULATORY_CARE_PROVIDER_SITE_OTHER): Payer: Self-pay

## 2015-04-25 ENCOUNTER — Ambulatory Visit (HOSPITAL_COMMUNITY): Payer: Self-pay

## 2015-05-03 ENCOUNTER — Other Ambulatory Visit (HOSPITAL_COMMUNITY): Payer: Self-pay

## 2015-05-04 ENCOUNTER — Ambulatory Visit (HOSPITAL_COMMUNITY): Admission: RE | Admit: 2015-05-04 | Payer: Self-pay | Source: Ambulatory Visit

## 2015-05-11 ENCOUNTER — Ambulatory Visit (HOSPITAL_COMMUNITY): Admission: RE | Admit: 2015-05-11 | Payer: Self-pay | Source: Ambulatory Visit

## 2015-05-28 ENCOUNTER — Encounter: Payer: Self-pay | Admitting: Physician Assistant

## 2015-05-28 ENCOUNTER — Ambulatory Visit: Payer: Self-pay | Admitting: Physician Assistant

## 2015-05-28 VITALS — BP 130/78 | HR 103 | Temp 99.0°F | Wt 178.8 lb

## 2015-05-28 DIAGNOSIS — F17218 Nicotine dependence, cigarettes, with other nicotine-induced disorders: Secondary | ICD-10-CM

## 2015-05-28 DIAGNOSIS — J069 Acute upper respiratory infection, unspecified: Secondary | ICD-10-CM

## 2015-05-28 NOTE — Progress Notes (Signed)
BP 130/78 mmHg  Pulse 103  Temp(Src) 99 F (37.2 C)  Wt 178 lb 12.8 oz (81.103 kg)  SpO2 97%   Subjective:    Patient ID: [name], female    DOB: 12/28/1980, 34 y.o.   MRN: [id number]  HPI: [name] is a 34 y.o. female presenting on 05/28/2015 for Ear Pain and Sore Throat   Sore Throat  This is a new problem. The current episode started in the past 7 days. There has been no fever. Associated symptoms include abdominal pain, congestion, coughing, diarrhea, ear pain, a hoarse voice and shortness of breath. Pertinent negatives include no drooling, ear discharge, headaches, neck pain, swollen glands, trouble swallowing or vomiting. Treatments tried: otc nasal steroid.  Otalgia  There is pain in both ears. This is a new problem. The current episode started in the past 7 days. There has been no fever. Associated symptoms include abdominal pain, coughing, diarrhea, a rash (axilla- from crutches), rhinorrhea and a sore throat. Pertinent negatives include no ear discharge, headaches, hearing loss, neck pain or vomiting. Treatments tried: otc nasal steroid. The treatment provided mild relief.   Pt still smoking  Relevant past medical, surgical, family and social history reviewed and updated as indicated. Interim medical history since our last visit reviewed. Allergies and medications reviewed and updated.  Review of Systems  Constitutional: Positive for chills and fatigue. Negative for fever, diaphoresis, appetite change and unexpected weight change.  HENT: Positive for congestion, ear pain, hoarse voice, mouth sores, rhinorrhea, sore throat and voice change. Negative for dental problem, drooling, ear discharge, facial swelling, hearing loss, sneezing and trouble swallowing.   Eyes: Positive for redness. Negative for pain, discharge, itching and visual disturbance.  Respiratory: Positive for cough, shortness of breath and wheezing. Negative for choking.   Cardiovascular: Negative for  chest pain, palpitations and leg swelling.  Gastrointestinal: Positive for abdominal pain and diarrhea. Negative for vomiting, constipation and blood in stool.  Endocrine: Negative for cold intolerance, heat intolerance and polydipsia.  Genitourinary: Negative for dysuria, hematuria and decreased urine volume.  Musculoskeletal: Positive for back pain, arthralgias and gait problem. Negative for neck pain.  Skin: Positive for rash (axilla- from crutches).  Allergic/Immunologic: Negative for environmental allergies.  Neurological: Negative for seizures, syncope, light-headedness and headaches.  Hematological: Negative for adenopathy.  Psychiatric/Behavioral: Negative for suicidal ideas, dysphoric mood and agitation. The patient is nervous/anxious.     Per HPI unless specifically indicated above     Objective:    BP 130/78 mmHg  Pulse 103  Temp(Src) 99 F (37.2 C)  Wt 178 lb 12.8 oz (81.103 kg)  SpO2 97%  Wt Readings from Last 3 Encounters:  05/28/15 178 lb 12.8 oz (81.103 kg)  04/12/15 176 lb 3.2 oz (79.924 kg)  04/09/15 173 lb (78.472 kg)    Physical Exam  Constitutional: She is oriented to person, place, and time. She appears well-developed and well-nourished.  HENT:  Head: Normocephalic and atraumatic.  Right Ear: Hearing, tympanic membrane, external ear and ear canal normal.  Left Ear: Hearing, tympanic membrane, external ear and ear canal normal.  Nose: Nose normal.  Mouth/Throat: Uvula is midline and oropharynx is clear and moist. No oropharyngeal exudate.  Neck: Neck supple.  Cardiovascular: Normal rate and regular rhythm.   Pulmonary/Chest: Effort normal and breath sounds normal. She has no wheezes.  Lymphadenopathy:    She has no cervical adenopathy.  Neurological: She is alert and oriented to person, place, and time.  Skin:  Skin is warm and dry.  Psychiatric: She has a normal mood and affect. Her behavior is normal.  Vitals reviewed.       Assessment & Plan:     Encounter Diagnoses  Name Primary?  . Acute upper respiratory infection Yes  . Cigarette nicotine dependence with other nicotine-induced disorder    Counseled  Rest, fluids, warm salt water gargles.  No smoking. RTO if persists

## 2015-07-20 ENCOUNTER — Encounter (INDEPENDENT_AMBULATORY_CARE_PROVIDER_SITE_OTHER): Payer: Self-pay | Admitting: Internal Medicine

## 2015-07-20 ENCOUNTER — Ambulatory Visit (INDEPENDENT_AMBULATORY_CARE_PROVIDER_SITE_OTHER): Payer: Self-pay | Admitting: Internal Medicine

## 2015-08-15 ENCOUNTER — Encounter: Payer: Self-pay | Admitting: Physician Assistant

## 2015-08-15 ENCOUNTER — Ambulatory Visit: Payer: Self-pay | Admitting: Physician Assistant

## 2015-08-15 VITALS — BP 140/96 | HR 98 | Temp 97.9°F | Ht 66.0 in | Wt 159.8 lb

## 2015-08-15 DIAGNOSIS — R490 Dysphonia: Secondary | ICD-10-CM

## 2015-08-15 DIAGNOSIS — R197 Diarrhea, unspecified: Secondary | ICD-10-CM

## 2015-08-15 DIAGNOSIS — N63 Unspecified lump in unspecified breast: Secondary | ICD-10-CM

## 2015-08-15 DIAGNOSIS — R251 Tremor, unspecified: Secondary | ICD-10-CM

## 2015-08-15 DIAGNOSIS — R1084 Generalized abdominal pain: Secondary | ICD-10-CM

## 2015-08-15 DIAGNOSIS — J029 Acute pharyngitis, unspecified: Secondary | ICD-10-CM

## 2015-08-15 DIAGNOSIS — R634 Abnormal weight loss: Secondary | ICD-10-CM

## 2015-08-15 DIAGNOSIS — R300 Dysuria: Secondary | ICD-10-CM

## 2015-08-15 LAB — POCT URINALYSIS DIPSTICK
Bilirubin, UA: NEGATIVE
Blood, UA: NEGATIVE
GLUCOSE UA: NEGATIVE
Ketones, UA: NEGATIVE
Leukocytes, UA: NEGATIVE
Nitrite, UA: NEGATIVE
PROTEIN UA: NEGATIVE
Spec Grav, UA: <=1.005
UROBILINOGEN UA: 0.2
pH, UA: 6

## 2015-08-15 MED ORDER — PROPRANOLOL HCL 20 MG PO TABS: 20.0000 mg | ORAL_TABLET | Freq: Two times a day (BID) | ORAL | Status: DC

## 2015-08-15 NOTE — Progress Notes (Signed)
BP 140/96 mmHg  Pulse 98  Temp(Src) 97.9 F (36.6 C)  Ht  (1.676 m)  Wt 159 lb 12.8 oz (72.485 kg)  BMI 25.80 kg/m2  SpO2 98%   Subjective:    Patient ID: [name], female    DOB: 1981/02/19, 35 y.o.   MRN: [id number]  HPI: [name] is a 35 y.o. female presenting on 08/15/2015 for Breast Mass; Ear Pain; Sore Throat; Dysuria; and Diarrhea   HPI   Chief Complaint  Patient presents with  . Breast Mass    to right breast  . Ear Pain    bilateral  . Sore Throat    has difficulty swallowing and eating, has lost 20 lbs since visit 2 mos ago  . Dysuria    urine is dark and pees frequently  . Diarrhea    abd pain is severe    -pt states she felt Breast mass onset Saturday- 4 days ago.  Mass is sore. No drainage from mass or nipple  -earache and sore throat started just prior to last OV on 05/28/15.  abdominal pain started about 2 months ago.  dysura started about a month ago. Pt states diarrhea started about 2 months ago  -Pt states tremors worse.  She says sometimes it stops.   -Pt has f/u with ortho March 1 at [facility] for her broken ankle  -pt went to GI for hepatitis treatment but Pt never returned to GI b/c provider was not nice to her.  Relevant past medical, surgical, family and social history reviewed and updated as indicated. Interim medical history since our last visit reviewed. Allergies and medications reviewed and updated.  Current outpatient prescriptions:  .  B Complex Vitamins (B-COMPLEX/B-12) LIQD, Place 1 mL under the tongue daily as needed., Disp: , Rfl:  .  fluticasone (FLONASE) 50 MCG/ACT nasal spray, Place 1 spray into both nostrils daily., Disp: , Rfl:  .  HYDROcodone-acetaminophen (NORCO/VICODIN) 5-325 MG tablet, Take 1-2 tablets by mouth every 6 (six) hours as needed for moderate pain., Disp: 30 tablet, Rfl: 0 .  ibuprofen (ADVIL,MOTRIN) 200 MG tablet, Take 400 mg by mouth every 6 (six) hours as needed for mild pain or moderate pain., Disp: ,  Rfl:     Review of Systems  Constitutional: Positive for fever, chills, diaphoresis, appetite change, fatigue and unexpected weight change.  HENT: Positive for congestion, drooling, ear pain, sneezing, sore throat, trouble swallowing and voice change. Negative for dental problem, facial swelling, hearing loss and mouth sores.   Eyes: Negative for pain, discharge, redness, itching and visual disturbance.  Respiratory: Positive for cough, choking, chest tightness, shortness of breath and wheezing.   Cardiovascular: Negative for chest pain, palpitations and leg swelling.  Gastrointestinal: Positive for abdominal pain and diarrhea. Negative for vomiting, constipation and blood in stool.  Endocrine: Negative for cold intolerance, heat intolerance and polydipsia.  Genitourinary: Positive for dysuria and decreased urine volume. Negative for hematuria.  Musculoskeletal: Positive for back pain and arthralgias. Negative for gait problem.  Skin: Negative for rash.  Allergic/Immunologic: Positive for environmental allergies.  Neurological: Positive for light-headedness and headaches. Negative for seizures and syncope.  Hematological: Negative for adenopathy.  Psychiatric/Behavioral: Positive for dysphoric mood and agitation. Negative for suicidal ideas. The patient is nervous/anxious.     Per HPI unless specifically indicated above     Objective:    BP 140/96 mmHg  Pulse 98  Temp(Src) 97.9 F (36.6 C)  Ht  (1.676 m)  Wt 159 lb 12.8 oz (72.485 kg)  BMI 25.80 kg/m2  SpO2 98%  Wt Readings from Last 3 Encounters:  08/15/15 159 lb 12.8 oz (72.485 kg)  05/28/15 178 lb 12.8 oz (81.103 kg)  04/12/15 176 lb 3.2 oz (79.924 kg)    Physical Exam  Constitutional: She is oriented to person, place, and time. She appears well-developed and well-nourished.  HENT:  Head: Normocephalic and atraumatic.  Right Ear: Hearing, tympanic membrane, external ear and ear canal normal.  Left Ear: Hearing,  tympanic membrane, external ear and ear canal normal.  Nose: Nose normal.  Mouth/Throat: Uvula is midline and oropharynx is clear and moist. No oropharyngeal exudate.  Neck: Neck supple.  Cardiovascular: Normal rate and regular rhythm.   Pulmonary/Chest: Effort normal and breath sounds normal. She has no wheezes.  Abdominal: Soft. Bowel sounds are normal. She exhibits no mass. There is no hepatosplenomegaly. There is generalized tenderness (mild). There is no rigidity, no rebound, no guarding and no CVA tenderness.  Genitourinary: No breast swelling, tenderness, discharge or bleeding.  Mass, approx 1 cm, palpable R breast 11 o'clock position.  Difficult to palpate in supine position, easy to palpate in upright position.  No skin dimpling or nipple discharge.  Musculoskeletal: She exhibits no edema.  Lymphadenopathy:    She has no cervical adenopathy.  Neurological: She is alert and oriented to person, place, and time. She displays tremor.  Skin: Skin is warm and dry.  Psychiatric: She has a normal mood and affect. Her behavior is normal.  Vitals reviewed.       Assessment & Plan:   Encounter Diagnoses  Name Primary?  . Breast mass Yes  . Sore throat   . Hoarse   . Diarrhea, unspecified type   . Generalized abdominal pain   . Loss of weight   . Tremor   . Dysuria     -Refer to ENT for persisitant sore throat with hoarseness -Refer to [facility] program for Diagnostic mammogram due to breast mass -pt given containers to collect Stool for  Studies to evaluate diarrhea -rx Low dose propranolol for tremor -Possible refer to different hep clinic since appears pt had not started work-up through [facility].  Will discuss further at f/u OV  -F/u 1 month.  RTO sooner prn worsening or new

## 2015-08-15 NOTE — Patient Instructions (Addendum)
refer to Ear Nose Throat at [facility] Diagnostic mammogram- nurse will call with information Stool studies- turn in poop to the lab Get blood drawn Low dose propranolol for tremor F/u 1 mo

## 2015-08-16 ENCOUNTER — Other Ambulatory Visit: Payer: Self-pay | Admitting: Physician Assistant

## 2015-08-16 LAB — COMPLETE METABOLIC PANEL WITH GFR
ALK PHOS: 55 U/L (ref 33–115)
ALT: 122 U/L — ABNORMAL HIGH (ref 6–29)
AST: 161 U/L — AB (ref 10–30)
Albumin: 4.2 g/dL (ref 3.6–5.1)
BUN: 7 mg/dL (ref 7–25)
CHLORIDE: 105 mmol/L (ref 98–110)
CO2: 25 mmol/L (ref 20–31)
Calcium: 8.9 mg/dL (ref 8.6–10.2)
Creat: 0.72 mg/dL (ref 0.50–1.10)
GFR, Est African American: >89 mL/min (ref >=60)
GLUCOSE: 110 mg/dL — AB (ref 65–99)
POTASSIUM: 4.6 mmol/L (ref 3.5–5.3)
SODIUM: 141 mmol/L (ref 135–146)
Total Bilirubin: 1.1 mg/dL (ref 0.2–1.2)
Total Protein: 7.1 g/dL (ref 6.1–8.1)

## 2015-08-16 LAB — CBC WITH DIFFERENTIAL/PLATELET
BASOS PCT: 0 % (ref 0–1)
Basophils Absolute: 0 10*3/uL (ref 0.0–0.1)
Eosinophils Absolute: 0.2 10*3/uL (ref 0.0–0.7)
Eosinophils Relative: 3 % (ref 0–5)
HEMATOCRIT: 43.2 % (ref 36.0–46.0)
HEMOGLOBIN: 15.1 g/dL — AB (ref 12.0–15.0)
LYMPHS ABS: 1.6 10*3/uL (ref 0.7–4.0)
LYMPHS PCT: 31 % (ref 12–46)
MCH: 36.7 pg — AB (ref 26.0–34.0)
MCHC: 35 g/dL (ref 30.0–36.0)
MCV: 105.1 fL — ABNORMAL HIGH (ref 78.0–100.0)
MONOS PCT: 15 % — AB (ref 3–12)
MPV: 10.3 fL (ref 8.6–12.4)
Monocytes Absolute: 0.8 10*3/uL (ref 0.1–1.0)
NEUTROS ABS: 2.7 10*3/uL (ref 1.7–7.7)
NEUTROS PCT: 51 % (ref 43–77)
Platelets: 163 10*3/uL (ref 150–400)
RBC: 4.11 MIL/uL (ref 3.87–5.11)
RDW: 13.2 % (ref 11.5–15.5)
WBC: 5.2 10*3/uL (ref 4.0–10.5)

## 2015-08-17 LAB — C. DIFFICILE GDH AND TOXIN A/B
C. DIFFICILE GDH: NOT DETECTED
C. difficile Toxin A/B: NOT DETECTED

## 2015-08-17 LAB — OVA AND PARASITE EXAMINATION: OP: NONE SEEN

## 2015-08-20 LAB — STOOL CULTURE

## 2015-09-11 ENCOUNTER — Other Ambulatory Visit (HOSPITAL_COMMUNITY): Payer: Self-pay | Admitting: *Deleted

## 2015-09-11 DIAGNOSIS — N6310 Unspecified lump in the right breast, unspecified quadrant: Secondary | ICD-10-CM

## 2015-09-13 ENCOUNTER — Ambulatory Visit: Payer: Self-pay | Admitting: Physician Assistant

## 2015-09-18 ENCOUNTER — Ambulatory Visit: Payer: Self-pay | Admitting: Physician Assistant

## 2015-09-18 ENCOUNTER — Encounter: Payer: Self-pay | Admitting: Physician Assistant

## 2015-09-18 ENCOUNTER — Ambulatory Visit (HOSPITAL_COMMUNITY)
Admission: RE | Admit: 2015-09-18 | Discharge: 2015-09-18 | Disposition: A | Discharge status: Home / Self Care | Payer: PRIVATE HEALTH INSURANCE | Source: Ambulatory Visit | Attending: *Deleted | Admitting: *Deleted

## 2015-09-18 VITALS — BP 142/92 | HR 67 | Temp 97.7°F | Ht 66.0 in | Wt 155.0 lb

## 2015-09-18 DIAGNOSIS — R490 Dysphonia: Secondary | ICD-10-CM

## 2015-09-18 DIAGNOSIS — F17219 Nicotine dependence, cigarettes, with unspecified nicotine-induced disorders: Secondary | ICD-10-CM

## 2015-09-18 DIAGNOSIS — N6310 Unspecified lump in the right breast, unspecified quadrant: Secondary | ICD-10-CM

## 2015-09-18 DIAGNOSIS — N63 Unspecified lump in unspecified breast: Secondary | ICD-10-CM

## 2015-09-18 DIAGNOSIS — R030 Elevated blood-pressure reading, without diagnosis of hypertension: Secondary | ICD-10-CM

## 2015-09-18 DIAGNOSIS — R251 Tremor, unspecified: Secondary | ICD-10-CM

## 2015-09-18 DIAGNOSIS — B1920 Unspecified viral hepatitis C without hepatic coma: Secondary | ICD-10-CM

## 2015-09-18 MED ORDER — ALBUTEROL SULFATE HFA 108 (90 BASE) MCG/ACT IN AERS: 2.0000 | INHALATION_SPRAY | Freq: Four times a day (QID) | RESPIRATORY_TRACT | Status: AC | PRN

## 2015-09-18 NOTE — Progress Notes (Signed)
BP 142/92 mmHg  Pulse 67  Temp(Src) 97.7 F (36.5 C)  Ht 5\' 6"  (1.676 m)  Wt 155 lb (70.308 kg)  BMI 25.03 kg/m2  SpO2 97%   Subjective:    Patient ID: [name], female    DOB: 10/16/1980, 35 y.o.   MRN: [id number]  HPI: [name] is a 35 y.o. female presenting on 09/18/2015 for Follow-up   HPI   Chief Complaint  Patient presents with  . Follow-up    has app[facility] 09[facility]20-15    Pt has appt with [facility] this afternoon for breast lump Pt has appt with ENT for hoarseness Pt says propranolol is helping her tremor some.  Pt says her diarrhea is gone Pt is having some wheezing and sob Pt states ankle is better   Relevant past medical, surgical, family and social history reviewed and updated as indicated. Interim medical history since our last visit reviewed. Allergies and medications reviewed and updated.  Current outpatient prescriptions:  .  B Complex Vitamins (B-COMPLEX/B-12) LIQD, Place 1 mL under the tongue daily as needed., Disp: , Rfl:  .  beta carotene w/minerals (OCUVITE) tablet, Take 1 tablet by mouth daily., Disp: , Rfl:  .  Calcium Carbonate-Vitamin D (CALCIUM-VITAMIN D) 500-200 MG-UNIT tablet, Take 1 tablet by mouth daily., Disp: , Rfl:  .  fluticasone (FLONASE) 50 MCG/ACT nasal spray, Place 1 spray into both nostrils daily. Reported on 09/18/2015, Disp: , Rfl:  .  ibuprofen (ADVIL,MOTRIN) 200 MG tablet, Take 400 mg by mouth every 6 (six) hours as needed for mild pain or moderate pain., Disp: , Rfl:  .  propranolol (INDERAL) 20 MG tablet, Take 1 tablet (20 mg total) by mouth 2 (two) times daily., Disp: 60 tablet, Rfl: 1  Review of Systems  Constitutional: Positive for chills, diaphoresis, appetite change, fatigue and unexpected weight change. Negative for fever.  HENT: Positive for congestion, ear pain, sore throat, trouble swallowing and voice change. Negative for dental problem, drooling, facial swelling, hearing loss, mouth sores and sneezing.    Eyes: Negative for pain, discharge, redness, itching and visual disturbance.  Respiratory: Positive for cough, choking, shortness of breath and wheezing.   Cardiovascular: Negative for chest pain, palpitations and leg swelling.  Gastrointestinal: Negative for vomiting, abdominal pain, diarrhea, constipation and blood in stool.  Endocrine: Negative for cold intolerance, heat intolerance and polydipsia.  Genitourinary: Negative for dysuria, hematuria and decreased urine volume.  Musculoskeletal: Negative for back pain, arthralgias and gait problem.  Skin: Negative for rash.  Allergic/Immunologic: Negative for environmental allergies.  Neurological: Positive for headaches. Negative for seizures, syncope and light-headedness.  Hematological: Negative for adenopathy.  Psychiatric/Behavioral: Positive for agitation. Negative for suicidal ideas and dysphoric mood. The patient is nervous/anxious.     Per HPI unless specifically indicated above     Objective:    BP 142/92 mmHg  Pulse 67  Temp(Src) 97.7 F (36.5 C)  Ht 5\' 6"  (1.676 m)  Wt 155 lb (70.308 kg)  BMI 25.03 kg/m2  SpO2 97%  Wt Readings from Last 3 Encounters:  09/18/15 155 lb (70.308 kg)  08/15/15 159 lb 12.8 oz (72.485 kg)  05/28/15 178 lb 12.8 oz (81.103 kg)    Physical Exam  Constitutional: She is oriented to person, place, and time. She appears well-developed and well-nourished.  HENT:  Head: Normocephalic and atraumatic.  Neck: Neck supple.  Cardiovascular: Normal rate and regular rhythm.   Pulmonary/Chest: Effort normal and breath sounds normal.  Abdominal: Soft. Bowel sounds are normal. She exhibits no mass. There is no hepatosplenomegaly. There is no tenderness.  Musculoskeletal: She exhibits no edema.  Lymphadenopathy:    She has no cervical adenopathy.  Neurological: She is alert and oriented to person, place, and time.  Skin: Skin is warm and dry.  Psychiatric: She has a normal mood and affect. Her behavior  is normal.  Vitals reviewed.   Results for orders placed or performed in visit on 08/16/15  Stool culture  Result Value Ref Range   Organism ID, Bacteria No Salmonella,Shigella,Campylobacter,Yersinia,or    Organism ID, Bacteria No E.coli [id number] isolated.   C. difficile GDH and Toxin A/B  Result Value Ref Range   C. difficile GDH Not Detected    C. difficile Toxin A/B Not Detected    Source Stool       Assessment & Plan:   Encounter Diagnoses  Name Primary?  . Cigarette nicotine dependence with nicotine-induced disorder Yes  . Elevated blood pressure reading without diagnosis of hypertension   . Tremor   . Breast mass   . Hoarse   . Hepatitis C virus infection without hepatic coma, unspecified chronicity     -appointment with ENT at [facility] as scheduled -Send to GI at [facility] for hepatitis -Continue propranolol -Order albuterol mdi/set up with [facility]ist -counseled on smoking cessation -F/u 1 month- to recheck bp and f/u on referrals and PAP

## 2015-09-21 DIAGNOSIS — F17219 Nicotine dependence, cigarettes, with unspecified nicotine-induced disorders: Secondary | ICD-10-CM | POA: Insufficient documentation

## 2015-09-21 DIAGNOSIS — R251 Tremor, unspecified: Secondary | ICD-10-CM | POA: Insufficient documentation

## 2015-09-21 DIAGNOSIS — B199 Unspecified viral hepatitis without hepatic coma: Secondary | ICD-10-CM | POA: Insufficient documentation

## 2015-09-21 DIAGNOSIS — R030 Elevated blood-pressure reading, without diagnosis of hypertension: Secondary | ICD-10-CM | POA: Insufficient documentation

## 2015-09-21 DIAGNOSIS — N63 Unspecified lump in unspecified breast: Secondary | ICD-10-CM | POA: Insufficient documentation

## 2015-09-21 DIAGNOSIS — R490 Dysphonia: Secondary | ICD-10-CM | POA: Insufficient documentation

## 2015-10-11 ENCOUNTER — Other Ambulatory Visit: Payer: Self-pay | Admitting: Physician Assistant

## 2015-10-18 ENCOUNTER — Ambulatory Visit: Payer: Self-pay | Admitting: Physician Assistant

## 2015-10-24 ENCOUNTER — Encounter: Payer: Self-pay | Admitting: Physician Assistant

## 2015-10-24 ENCOUNTER — Ambulatory Visit: Payer: Self-pay | Admitting: Physician Assistant

## 2015-10-24 VITALS — BP 110/80 | HR 85 | Temp 97.9°F | Ht 66.0 in | Wt 157.7 lb

## 2015-10-24 DIAGNOSIS — R251 Tremor, unspecified: Secondary | ICD-10-CM

## 2015-10-24 DIAGNOSIS — F17219 Nicotine dependence, cigarettes, with unspecified nicotine-induced disorders: Secondary | ICD-10-CM

## 2015-10-24 DIAGNOSIS — M25512 Pain in left shoulder: Secondary | ICD-10-CM

## 2015-10-24 DIAGNOSIS — R490 Dysphonia: Secondary | ICD-10-CM

## 2015-10-24 MED ORDER — DICLOFENAC SODIUM 75 MG PO TBEC: 75.0000 mg | DELAYED_RELEASE_TABLET | Freq: Two times a day (BID) | ORAL | Status: DC

## 2015-10-24 NOTE — Progress Notes (Signed)
BP 110/80 mmHg  Pulse 85  Temp(Src) 97.9 F (36.6 C)  Ht 5\' 6"  (1.676 m)  Wt 157 lb 11.2 oz (71.532 kg)  BMI 25.47 kg/m2  SpO2 95%  LMP 10/18/2015   Subjective:    Patient ID: [name], female    DOB: 12/03/1980, 35 y.o.   MRN: [id number]  HPI: [name] is a 35 y.o. female presenting on 10/24/2015 for Hypertension; Gynecologic Exam; and Shoulder Pain   HPI   Chief Complaint  Patient presents with  . Hypertension  . Gynecologic Exam  . Shoulder Pain    left shoulder pain since Sunday, denies injury     Pt went to ENT for hoarseness and was given antifungal medication  She has app[facility] in July with GI [facility]  Pt doesn't want to do PAP today b/c she is spotting  She is still liking the propranolol- it works well for her tremor  She had f/u on breast lump and she says it was normal  Pt is c/o her should hurting- not aware of injury- pain since Sunday  Relevant past medical, surgical, family and social history reviewed and updated as indicated. Interim medical history since our last visit reviewed. Allergies and medications reviewed and updated.  CURRENT MEDS: Albuterol MDI Vit b Beta carotene flonase IBU propranolol   Review of Systems  Constitutional: Positive for diaphoresis, activity change, fatigue and unexpected weight change. Negative for fever, chills and appetite change.  HENT: Positive for congestion, sore throat, trouble swallowing and voice change. Negative for dental problem, drooling, ear pain, facial swelling, hearing loss, mouth sores and sneezing.   Eyes: Negative for pain, discharge, redness, itching and visual disturbance.  Respiratory: Positive for cough and wheezing. Negative for choking and shortness of breath.   Cardiovascular: Negative for chest pain, palpitations and leg swelling.  Gastrointestinal: Negative for vomiting, diarrhea, constipation and blood in stool.  Endocrine: Negative for cold intolerance, heat  intolerance and polydipsia.  Genitourinary: Negative for dysuria, hematuria and decreased urine volume.  Musculoskeletal: Negative for back pain, arthralgias and gait problem.  Skin: Negative for rash.  Allergic/Immunologic: Negative for environmental allergies.  Neurological: Negative for seizures, syncope, light-headedness and headaches.  Hematological: Negative for adenopathy.  Psychiatric/Behavioral: Negative for suicidal ideas, dysphoric mood and agitation. The patient is nervous/anxious.     Per HPI unless specifically indicated above     Objective:    BP 110/80 mmHg  Pulse 85  Temp(Src) 97.9 F (36.6 C)  Ht 5\' 6"  (1.676 m)  Wt 157 lb 11.2 oz (71.532 kg)  BMI 25.47 kg/m2  SpO2 95%  LMP 10/18/2015  Wt Readings from Last 3 Encounters:  10/24/15 157 lb 11.2 oz (71.532 kg)  09/18/15 155 lb (70.308 kg)  08/15/15 159 lb 12.8 oz (72.485 kg)    Physical Exam  Constitutional: She is oriented to person, place, and time. She appears well-developed and well-nourished.  HENT:  Head: Normocephalic and atraumatic.  Voice hoarseness  Neck: Neck supple.  Cardiovascular: Normal rate and regular rhythm.   Pulmonary/Chest: Effort normal and breath sounds normal.  Abdominal: Soft. Bowel sounds are normal. She exhibits no mass. There is no hepatosplenomegaly. There is no tenderness.  Musculoskeletal: She exhibits no edema.       Left shoulder: She exhibits decreased range of motion and tenderness. She exhibits no bony tenderness, no swelling and no deformity.  Lymphadenopathy:    She has no cervical adenopathy.  Neurological: She is alert and  oriented to person, place, and time.  Skin: Skin is warm and dry.  Psychiatric: She has a normal mood and affect. Her behavior is normal.  Vitals reviewed.      Assessment & Plan:   Encounter Diagnoses  Name Primary?  . Left shoulder pain Yes  . Hoarse   . Tremor   . Cigarette nicotine dependence with nicotine-induced disorder     -bp  improved today.  Continue current Rx -rx diclofenac for shoulder pain. Pt cautioned to avoid ibu and other nsaids while taking this medication. Also recommended ice pack several times daily. -encouraged pt to f/u with ENT for continuing hoarseness -f/u 1 month to recheck shoulder and do PAP at that time

## 2015-11-27 ENCOUNTER — Ambulatory Visit: Payer: Self-pay | Admitting: Physician Assistant

## 2015-12-11 ENCOUNTER — Ambulatory Visit: Payer: Self-pay | Admitting: Physician Assistant

## 2015-12-19 ENCOUNTER — Ambulatory Visit: Payer: Self-pay | Admitting: Physician Assistant

## 2015-12-19 ENCOUNTER — Ambulatory Visit (HOSPITAL_COMMUNITY)
Admission: RE | Admit: 2015-12-19 | Discharge: 2015-12-19 | Disposition: A | Discharge status: Home / Self Care | Payer: Self-pay | Source: Ambulatory Visit | Attending: Physician Assistant | Admitting: Physician Assistant

## 2015-12-19 ENCOUNTER — Encounter: Payer: Self-pay | Admitting: Physician Assistant

## 2015-12-19 ENCOUNTER — Other Ambulatory Visit: Payer: Self-pay | Admitting: Physician Assistant

## 2015-12-19 VITALS — BP 126/90 | HR 70 | Temp 97.5°F | Ht 66.0 in | Wt 146.9 lb

## 2015-12-19 DIAGNOSIS — R251 Tremor, unspecified: Secondary | ICD-10-CM

## 2015-12-19 DIAGNOSIS — F17219 Nicotine dependence, cigarettes, with unspecified nicotine-induced disorders: Secondary | ICD-10-CM

## 2015-12-19 DIAGNOSIS — B1920 Unspecified viral hepatitis C without hepatic coma: Secondary | ICD-10-CM

## 2015-12-19 DIAGNOSIS — Z9889 Other specified postprocedural states: Secondary | ICD-10-CM | POA: Insufficient documentation

## 2015-12-19 DIAGNOSIS — R490 Dysphonia: Secondary | ICD-10-CM

## 2015-12-19 DIAGNOSIS — M25571 Pain in right ankle and joints of right foot: Secondary | ICD-10-CM | POA: Insufficient documentation

## 2015-12-19 DIAGNOSIS — Z124 Encounter for screening for malignant neoplasm of cervix: Secondary | ICD-10-CM

## 2015-12-19 MED ORDER — PROPRANOLOL HCL 20 MG PO TABS: 20.0000 mg | ORAL_TABLET | Freq: Two times a day (BID) | ORAL | Status: AC

## 2015-12-19 NOTE — Patient Instructions (Signed)
Xray ankle- will call with result-  Ice, elevate, ace wrap [facility] discount application Propranolol in mail Dr Dawson/generic [facility]osec [facility] Check on GI appointment date

## 2015-12-19 NOTE — Progress Notes (Signed)
BP 126/90 mmHg  Pulse 70  Temp(Src) 97.5 F (36.4 C)  Ht 5\' 6"  (1.676 m)  Wt 146 lb 14.4 oz (66.633 kg)  BMI 23.72 kg/m2  SpO2 98%  LMP 12/11/2015   Subjective:    Patient ID: [name], female    DOB: 04/02/1981, 35 y.o.   MRN: [id number]  HPI: [name] SlaterMisty D Gaiser is a 35 y.o. female presenting on 12/19/2015 for Shoulder Pain and Gynecologic Exam   HPI   Pt has appt 7/18 with ENT for "light down her throat"  She has appt in July with liver doctor for hepatitis  Pt fell and injured R ankle- this is the ankle that she broke and had surgery on in octo 2016.  Pt still smoking but less  Relevant past medical, surgical, family and social history reviewed and updated as indicated. Interim medical history since our last visit reviewed. Allergies and medications reviewed and updated.   Current outpatient prescriptions:  .  albuterol (PROVENTIL HFA;VENTOLIN HFA) 108 (90 Base) MCG/ACT inhaler, Inhale 2 puffs into the lungs every 6 (six) hours as needed for wheezing or shortness of breath., Disp: 3 Inhaler, Rfl: 1 .  B Complex Vitamins (B-COMPLEX/B-12) LIQD, Place 1 mL under the tongue daily as needed., Disp: , Rfl:  .  Calcium Carbonate-Vitamin D (CALCIUM-VITAMIN D) 500-200 MG-UNIT tablet, Take 1 tablet by mouth daily. Reported on 10/24/2015, Disp: , Rfl:  .  ibuprofen (ADVIL,MOTRIN) 200 MG tablet, Take 400 mg by mouth every 6 (six) hours as needed for mild pain or moderate pain., Disp: , Rfl:  .  propranolol (INDERAL) 20 MG tablet, TAKE 1 TABLET BY MOUTH TWICE DAILY, Disp: 60 tablet, Rfl: 3 .  fluticasone (FLONASE) 50 MCG/ACT nasal spray, Place 1 spray into both nostrils daily. Reported on 12/19/2015, Disp: , Rfl:  .  pantoprazole (PROTONIX) 20 MG tablet, Take 20 mg by mouth daily., Disp: , Rfl:   Review of Systems  Constitutional: Positive for activity change and fatigue. Negative for fever, chills, diaphoresis, appetite change and unexpected weight change.  HENT: Positive for sore  throat, trouble swallowing and voice change. Negative for congestion, dental problem, drooling, ear pain, facial swelling, hearing loss, mouth sores and sneezing.   Eyes: Negative for pain, discharge, redness, itching and visual disturbance.  Respiratory: Positive for cough, choking and wheezing. Negative for shortness of breath.   Cardiovascular: Negative for chest pain, palpitations and leg swelling.  Gastrointestinal: Negative for vomiting, abdominal pain, diarrhea, constipation and blood in stool.  Endocrine: Negative for cold intolerance, heat intolerance and polydipsia.  Genitourinary: Negative for dysuria, hematuria and decreased urine volume.  Musculoskeletal: Negative for back pain, arthralgias and gait problem.  Skin: Negative for rash.  Allergic/Immunologic: Negative for environmental allergies.  Neurological: Negative for seizures, syncope, light-headedness and headaches.  Hematological: Negative for adenopathy.  Psychiatric/Behavioral: Negative for suicidal ideas, dysphoric mood and agitation. The patient is nervous/anxious.     Per HPI unless specifically indicated above     Objective:    BP 126/90 mmHg  Pulse 70  Temp(Src) 97.5 F (36.4 C)  Ht 5\' 6"  (1.676 m)  Wt 146 lb 14.4 oz (66.633 kg)  BMI 23.72 kg/m2  SpO2 98%  LMP 12/11/2015  Wt Readings from Last 3 Encounters:  12/19/15 146 lb 14.4 oz (66.633 kg)  10/24/15 157 lb 11.2 oz (71.532 kg)  09/18/15 155 lb (70.308 kg)    Physical Exam  Constitutional: She is oriented to person, place, and time. She appears well-developed  and well-nourished.  Neck: Neck supple.  Pulmonary/Chest: Effort normal and breath sounds normal. Right breast exhibits no inverted nipple, no mass, no nipple discharge, no skin change and no tenderness. Left breast exhibits no inverted nipple, no mass, no nipple discharge, no skin change and no tenderness. Breasts are symmetrical.  Abdominal: Soft. Bowel sounds are normal. She exhibits no  distension and no mass. There is no tenderness. There is no rebound and no guarding.  Genitourinary: Vagina normal and uterus normal. No breast swelling, tenderness, discharge or bleeding. Cervix exhibits no motion tenderness, no discharge and no friability. Right adnexum displays no mass, no tenderness and no fullness. Left adnexum displays no mass, no tenderness and no fullness.  (nurse Lupita Leashonna assisted)  Neurological: She is alert and oriented to person, place, and time.  Skin: Skin is warm and dry.  Psychiatric: She has a normal mood and affect. Her behavior is normal.  Nursing note and vitals reviewed.       Assessment & Plan:    Encounter Diagnoses  Name Primary?  . Pain in right ankle Yes  . Cigarette nicotine dependence with nicotine-induced disorder   . Tremor   . Hoarse   . Pap smear for cervical cancer screening   . Hepatitis C virus infection without hepatic coma, unspecified chronicity     -Xray ankle -Get pt signed up for Pt Assistance for propranolol -ENT and GI as scheduled -F/u 3 months. RTO sooner prn

## 2015-12-20 LAB — PAP, THINPREP RFLX HPV

## 2016-01-01 ENCOUNTER — Other Ambulatory Visit: Payer: Self-pay | Admitting: Physician Assistant

## 2016-01-01 MED ORDER — METRONIDAZOLE 500 MG PO TABS: 2000.0000 mg | ORAL_TABLET | Freq: Once | ORAL | Status: DC

## 2016-01-30 ENCOUNTER — Other Ambulatory Visit: Payer: Self-pay | Admitting: Physician Assistant

## 2016-01-31 ENCOUNTER — Other Ambulatory Visit: Payer: Self-pay | Admitting: Physician Assistant

## 2016-03-20 ENCOUNTER — Ambulatory Visit: Payer: Self-pay | Admitting: Physician Assistant

## 2016-03-26 ENCOUNTER — Encounter: Payer: Self-pay | Admitting: Physician Assistant

## 2016-05-22 ENCOUNTER — Ambulatory Visit: Payer: Self-pay | Admitting: Physician Assistant

## 2016-05-22 ENCOUNTER — Encounter: Payer: Self-pay | Admitting: Physician Assistant

## 2016-05-22 VITALS — BP 138/96 | HR 79 | Temp 97.7°F | Ht 66.0 in | Wt 145.4 lb

## 2016-05-22 DIAGNOSIS — R490 Dysphonia: Secondary | ICD-10-CM

## 2016-05-22 NOTE — Progress Notes (Signed)
BP (!) 138/96 (BP Location: Left Arm, Patient Position: Sitting, Cuff Size: Normal)   Pulse 79   Temp 97.7 F (36.5 C) (Other (Comment))   Ht 5\' 6"  (1.676 m)   Wt 145 lb 6.4 oz (66 kg)   LMP 05/15/2016 (Approximate)   SpO2 95%   BMI 23.47 kg/m    Subjective:    Patient ID: [name] SlaterMisty D Bell, female    DOB: 11/16/1980, 35 y.o.   MRN: [id number]  HPI: [name] is a 35 y.o. female presenting on 05/22/2016 for Follow-up ([facility] has [facility] disease in the near future)   HPI  Pt in today for routine follow up.  She says she is seeing new PCP now, a dr Trudee Kusterandall Conrad, [facility] in [place].   She conve[facility]  Relevant past medical, surgical, family and social history reviewed and updated as indicated. Interim medical history since our last visit reviewed. Allergies and medications reviewed and updated.   Current Outpatient Prescriptions:  .  albuterol (PROVENTIL HFA;VENTOLIN HFA) 108 (90 Base) MCG/ACT inhaler, Inhale 2 puffs into the lungs every 6 (six) hours as needed for wheezing or shortness of breath., Disp: 3 Inhaler, Rfl: 1 .  amLODipine (NORVASC) 10 MG tablet, Take 10 mg by mouth daily., Disp: , Rfl:  .  B Complex Vitamins (B-COMPLEX/B-12) LIQD, Place 1 mL under the tongue daily as needed., Disp: , Rfl:  .  budesonide-formoterol (SYMBICORT) 160-4.5 MCG/ACT inhaler, Inhale 2 puffs into the lungs 2 (two) times daily., Disp: , Rfl:  .  gabapentin (NEURONTIN) 400 MG capsule, Take 400 mg by mouth 2 (two) times daily., Disp: , Rfl:  .  isotretinoin (ACCUTANE) 10 MG capsule, Take 10 mg by mouth daily., Disp: , Rfl:  .  pantoprazole (PROTONIX) 20 MG tablet, Take 20 mg by mouth daily., Disp: , Rfl:  .  propranolol (INDERAL) 20 MG tablet, Take 1 tablet (20 mg total) by mouth 2 (two) times daily. (Patient taking differently: Take 40 mg by mouth 2 (two) times daily. ), Disp: 180 tablet, Rfl: 3 .  Calcium Carbonate-Vitamin D  (CALCIUM-VITAMIN D) 500-200 MG-UNIT tablet, Take 1 tablet by mouth daily. Reported on 10/24/2015, Disp: , Rfl:  .  fluticasone (FLONASE) 50 MCG/ACT nasal spray, Place 1 spray into both nostrils daily. Reported on 12/19/2015, Disp: , Rfl:  .  ibuprofen (ADVIL,MOTRIN) 200 MG tablet, Take 400 mg by mouth every 6 (six) hours as needed for mild pain or moderate pain., Disp: , Rfl:  .  metroNIDAZOLE (FLAGYL) 500 MG tablet, Take 4 tablets (2,000 mg total) by mouth once. (Patient not taking: Reported on 05/22/2016), Disp: 4 tablet, Rfl: 0 .  propranolol (INDERAL) 20 MG tablet, TAKE 1 TABLET BY MOUTH TWICE DAILY (Patient not taking: Reported on 05/22/2016), Disp: 60 tablet, Rfl: 3   Review of Systems  Constitutional: Positive for appetite change, chills, diaphoresis, fatigue and unexpected weight change. Negative for fever.  HENT: Positive for congestion, ear pain, mouth sores, sneezing, sore throat, trouble swallowing and voice change. Negative for dental problem, drooling, facial swelling and hearing loss.   Eyes: Negative for pain, discharge, redness, itching and visual disturbance.  Respiratory: Positive for cough, choking, shortness of breath and wheezing.   Cardiovascular: Positive for chest pain and palpitations. Negative for leg swelling.  Gastrointestinal: Positive for abdominal pain. Negative for blood in stool, constipation, diarrhea and vomiting.  Endocrine: Negative for cold intolerance, heat intolerance and polydipsia.  Genitourinary: Positive for decreased urine volume. Negative for dysuria and hematuria.  Musculoskeletal: Positive for arthralgias, back pain and gait problem.  Skin: Positive for rash.  Allergic/Immunologic: Negative for environmental allergies.  Neurological: Positive for light-headedness and headaches. Negative for seizures and syncope.  Hematological: Positive for adenopathy.  Psychiatric/Behavioral: Negative for agitation, dysphoric mood and suicidal ideas. The patient is  nervous/anxious.     Per HPI unless specifically indicated above     Objective:    BP (!) 138/96 (BP Location: Left Arm, Patient Position: Sitting, Cuff Size: Normal)   Pulse 79   Temp 97.7 F (36.5 C) (Other (Comment))   Ht 5\' 6"  (1.676 m)   Wt 145 lb 6.4 oz (66 kg)   LMP 05/15/2016 (Approximate)   SpO2 95%   BMI 23.47 kg/m   Wt Readings from Last 3 Encounters:  05/22/16 145 lb 6.4 oz (66 kg)  12/19/15 146 lb 14.4 oz (66.6 kg)  10/24/15 157 lb 11.2 oz (71.5 kg)    Physical Exam  Constitutional: She is oriented to person, place, and time. She appears well-developed and well-nourished.  HENT:  Voice hoarse and raspy  Pulmonary/Chest: Effort normal.  Neurological: She is alert and oriented to person, place, and time.  Skin: Skin is warm and dry.  Psychiatric: She has a normal mood and affect. Her behavior is normal.  Nursing note and vitals reviewed.      Assessment & Plan:   Encounter Diagnosis  Name Primary?  . Hoarse Yes     Follow up with new PCP as scheduled

## 2017-01-11 ENCOUNTER — Emergency Department (HOSPITAL_COMMUNITY)
Admission: EM | Admit: 2017-01-11 | Discharge: 2017-01-11 | Disposition: A | Discharge status: Home / Self Care | Payer: Self-pay | Attending: Emergency Medicine | Admitting: Emergency Medicine

## 2017-01-11 ENCOUNTER — Encounter (HOSPITAL_COMMUNITY): Payer: Self-pay | Admitting: Emergency Medicine

## 2017-01-11 ENCOUNTER — Emergency Department (HOSPITAL_COMMUNITY): Payer: Self-pay

## 2017-01-11 DIAGNOSIS — F10929 Alcohol use, unspecified with intoxication, unspecified: Secondary | ICD-10-CM | POA: Insufficient documentation

## 2017-01-11 DIAGNOSIS — F17210 Nicotine dependence, cigarettes, uncomplicated: Secondary | ICD-10-CM | POA: Insufficient documentation

## 2017-01-11 DIAGNOSIS — Y92007 Garden or yard of unspecified non-institutional (private) residence as the place of occurrence of the external cause: Secondary | ICD-10-CM | POA: Insufficient documentation

## 2017-01-11 DIAGNOSIS — S161XXA Strain of muscle, fascia and tendon at neck level, initial encounter: Secondary | ICD-10-CM | POA: Insufficient documentation

## 2017-01-11 DIAGNOSIS — Y9311 Activity, swimming: Secondary | ICD-10-CM | POA: Insufficient documentation

## 2017-01-11 DIAGNOSIS — H539 Unspecified visual disturbance: Secondary | ICD-10-CM | POA: Insufficient documentation

## 2017-01-11 DIAGNOSIS — S0990XA Unspecified injury of head, initial encounter: Secondary | ICD-10-CM | POA: Insufficient documentation

## 2017-01-11 DIAGNOSIS — W16522A Jumping or diving into swimming pool striking bottom causing other injury, initial encounter: Secondary | ICD-10-CM | POA: Insufficient documentation

## 2017-01-11 DIAGNOSIS — Y999 Unspecified external cause status: Secondary | ICD-10-CM | POA: Insufficient documentation

## 2017-01-11 LAB — COMPREHENSIVE METABOLIC PANEL
ALBUMIN: 4 g/dL (ref 3.5–5.0)
ALT: 74 U/L — ABNORMAL HIGH (ref 14–54)
AST: 98 U/L — AB (ref 15–41)
Alkaline Phosphatase: 61 U/L (ref 38–126)
Anion gap: 11 (ref 5–15)
BUN: 7 mg/dL (ref 6–20)
CHLORIDE: 112 mmol/L — AB (ref 101–111)
CO2: 24 mmol/L (ref 22–32)
Calcium: 8.7 mg/dL — ABNORMAL LOW (ref 8.9–10.3)
Creatinine, Ser: 0.52 mg/dL (ref 0.44–1.00)
GFR calc Af Amer: >60 mL/min (ref >60)
GFR calc non Af Amer: >60 mL/min (ref >60)
GLUCOSE: 93 mg/dL (ref 65–99)
POTASSIUM: 3.8 mmol/L (ref 3.5–5.1)
Sodium: 147 mmol/L — ABNORMAL HIGH (ref 135–145)
Total Bilirubin: 0.6 mg/dL (ref 0.3–1.2)
Total Protein: 7.2 g/dL (ref 6.5–8.1)

## 2017-01-11 LAB — RAPID URINE DRUG SCREEN, HOSP PERFORMED
AMPHETAMINES: NOT DETECTED
BARBITURATES: NOT DETECTED
BENZODIAZEPINES: NOT DETECTED
Cocaine: NOT DETECTED
Opiates: POSITIVE — AB
Tetrahydrocannabinol: NOT DETECTED

## 2017-01-11 LAB — URINALYSIS, ROUTINE W REFLEX MICROSCOPIC
BILIRUBIN URINE: NEGATIVE
Glucose, UA: NEGATIVE mg/dL
HGB URINE DIPSTICK: NEGATIVE
Ketones, ur: NEGATIVE mg/dL
Leukocytes, UA: NEGATIVE
Nitrite: NEGATIVE
PROTEIN: NEGATIVE mg/dL
Specific Gravity, Urine: 1.003 — ABNORMAL LOW (ref 1.005–1.030)
pH: 6 (ref 5.0–8.0)

## 2017-01-11 LAB — CBC WITH DIFFERENTIAL/PLATELET
BASOS ABS: 0 10*3/uL (ref 0.0–0.1)
BASOS PCT: 0 %
EOS PCT: 1 %
Eosinophils Absolute: 0.2 10*3/uL (ref 0.0–0.7)
HEMATOCRIT: 37.9 % (ref 36.0–46.0)
Hemoglobin: 13.2 g/dL (ref 12.0–15.0)
Lymphocytes Relative: 24 %
Lymphs Abs: 2.8 10*3/uL (ref 0.7–4.0)
MCH: 36.6 pg — ABNORMAL HIGH (ref 26.0–34.0)
MCHC: 34.8 g/dL (ref 30.0–36.0)
MCV: 105 fL — ABNORMAL HIGH (ref 78.0–100.0)
MONO ABS: 1.3 10*3/uL — AB (ref 0.1–1.0)
Monocytes Relative: 11 %
NEUTROS ABS: 7.5 10*3/uL (ref 1.7–7.7)
Neutrophils Relative %: 63 %
PLATELETS: 118 10*3/uL — AB (ref 150–400)
RBC: 3.61 MIL/uL — ABNORMAL LOW (ref 3.87–5.11)
RDW: 12.6 % (ref 11.5–15.5)
WBC: 11.8 10*3/uL — ABNORMAL HIGH (ref 4.0–10.5)

## 2017-01-11 LAB — PREGNANCY, URINE: PREG TEST UR: NEGATIVE

## 2017-01-11 LAB — CBG MONITORING, ED: GLUCOSE-CAPILLARY: 112 mg/dL — AB (ref 65–99)

## 2017-01-11 LAB — ETHANOL: Alcohol, Ethyl (B): 361 mg/dL (ref <5)

## 2017-01-11 NOTE — ED Provider Notes (Signed)
Emergency Department Provider Note   I have reviewed the triage vital signs and the nursing notes.   HISTORY  Chief Complaint Fatigue   HPI [name] is a 36 y.o. female with PMH of Hep C, Bipolar disorder, and seizures presents to the ED with multiple medical complaints including multiple days of "spells" where she has total body pain, vision changes, and lip tingling. The patient's father is at bedside states that she has a Lanyia Jewel history of alcohol and drug abuse. The patient denies using any illicit drugs. She states that she did drink last night and a very small amount this morning but none since early this morning. She denies any focal pain in her abdomen or chest. No dyspnea. She does have some subjective fever at home. She is especially concerned regarding a head injury after jumping in her mother's pool 1 week prior and hitting her head. No arm/leg numbness or tingling at this time. No radiation of pain.   Past Medical History:  Diagnosis Date  . Anxiety   . Bipolar 1 disorder (HCC)   . Hepatitis C   . Seizures (HCC)     Patient Active Problem List   Diagnosis Date Noted  . Cigarette nicotine dependence with nicotine-induced disorder 09/21/2015  . Elevated blood pressure reading without diagnosis of hypertension 09/21/2015  . Tremor 09/21/2015  . Breast mass 09/21/2015  . Hoarse 09/21/2015  . Hepatitis, viral 09/21/2015  . Polysubstance abuse 10/11/2013  . Alcohol withdrawal (HCC) 10/11/2013    Past Surgical History:  Procedure Laterality Date  . TONSILLECTOMY    . TUBAL LIGATION      Current Outpatient Rx  . Order #: [id number] Class: Print  . Order #: [id number] Class: Historical Med  . Order #: [id number] Class: Historical Med  . Order #: [id number] Class: Historical Med  . Order #: [id number] Class: Historical Med  . Order #: [id number] Class: Historical Med  . Order #: [id number] Class: Print  . Order #: [id number] Class: Historical Med  . Order #: [id number] Class:  Historical Med  . Order #: [id number] Class: Historical Med  . Order #: [id number] Class: Historical Med  . Order #: [id number] Class: Historical Med  . Order #: [id number] Class: Historical Med  . Order #: [id number] Class: Historical Med  . Order #: [id number] Class: Historical Med    Allergies Patient has no known allergies.  Family History  Problem Relation Age of Onset  . Fibromyalgia Mother   . Asthma Mother   . Heart failure Mother   . Heart failure Father   . Cancer Father   . Heart failure Other     Social History Social History  Substance Use Topics  . Smoking status: Current Every Day Smoker    Packs/day: 0.50    Years: 18.00    Types: Cigarettes  . Smokeless tobacco: Never Used  . Alcohol use 0.6 oz/week    1 Cans of beer per week     Comment: drinks beer daily at least one. Maximum is a six pack once on weekend.    Review of Systems  Constitutional: Positive subjective fever. Eyes: Positive intermittent double vision.  ENT: No sore throat. Cardiovascular: Denies chest pain. Respiratory: Denies shortness of breath. Gastrointestinal: No abdominal pain. Positive nausea, no vomiting.  No diarrhea.  No constipation. Genitourinary: Negative for dysuria. Musculoskeletal: Positive total body pain.  Skin: Negative for rash. Neurological: Negative for focal weakness or numbness. Positive HA.   10-point ROS otherwise negative.  ____________________________________________   PHYSICAL EXAM:  VITAL SIGNS: ED Triage Vitals  Enc Vitals Group     BP 01/11/17 1828 (!) 164/112     Pulse Rate 01/11/17 1828 74     Resp 01/11/17 1828 18     Temp 01/11/17 1828 98 F (36.7 C)     Temp Source 01/11/17 1828 Oral     SpO2 01/11/17 1828 97 %     Weight 01/11/17 1828 150 lb (68 kg)     Height 01/11/17 1828 5\' 5"  (1.651 m)     Pain Score 01/11/17 1830 10   Constitutional: Appears intoxicated. She tells a fragmented history and is frequently shifting in bed and is tangential at  times.  Eyes: Conjunctivae are normal.  Head: Atraumatic. Nose: No congestion/rhinnorhea. Mouth/Throat: Mucous membranes are dry.  Neck: No stridor.  No meningeal signs. No cervical spine tenderness to palpation. Cardiovascular: Normal rate, regular rhythm. Good peripheral circulation. Grossly normal heart sounds.   Respiratory: Normal respiratory effort.  No retractions. Lungs CTAB. Gastrointestinal: Soft and nontender. No distention.  Musculoskeletal: No lower extremity tenderness nor edema. No gross deformities of extremities. Neurologic: Slurred speech. No gross focal neurologic deficits are appreciated.  Skin:  Skin is warm, dry and intact. No rash noted. Psychiatric: Mood and affect are normal. Patient appears intoxicated. Tangential at times.   ____________________________________________   LABS (all labs ordered are listed, but only abnormal results are displayed)  Labs Reviewed  CBC WITH DIFFERENTIAL/PLATELET - Abnormal; Notable for the following:       Result Value   WBC 11.8 (*)    RBC 3.61 (*)    MCV 105.0 (*)    MCH 36.6 (*)    Platelets 118 (*)    Monocytes Absolute 1.3 (*)    All other components within normal limits  COMPREHENSIVE METABOLIC PANEL - Abnormal; Notable for the following:    Sodium 147 (*)    Chloride 112 (*)    Calcium 8.7 (*)    AST 98 (*)    ALT 74 (*)    All other components within normal limits  ETHANOL - Abnormal; Notable for the following:    Alcohol, Ethyl (B) 361 (*)    All other components within normal limits  RAPID URINE DRUG SCREEN, HOSP PERFORMED - Abnormal; Notable for the following:    Opiates POSITIVE (*)    All other components within normal limits  URINALYSIS, ROUTINE W REFLEX MICROSCOPIC - Abnormal; Notable for the following:    Color, Urine STRAW (*)    Specific Gravity, Urine 1.003 (*)    All other components within normal limits  CBG MONITORING, ED - Abnormal; Notable for the following:    Glucose-Capillary 112 (*)     All other components within normal limits  PREGNANCY, URINE   ____________________________________________  RADIOLOGY  Ct Head Wo Contrast  Result Date: 01/11/2017 CLINICAL DATA:  Generalize weakness and blurred vision over the last 3 days. Fell with trauma to the head 1 week ago. Left neck pain. EXAM: CT HEAD WITHOUT CONTRAST CT CERVICAL SPINE WITHOUT CONTRAST TECHNIQUE: Multidetector CT imaging of the head and cervical spine was performed following the standard protocol without intravenous contrast. Multiplanar CT image reconstructions of the cervical spine were also generated. COMPARISON:  11/04/2014 FINDINGS: CT HEAD FINDINGS Brain: No evidence of malformation, atrophy, old or acute small or large vessel infarction, mass lesion, hemorrhage, hydrocephalus or extra-axial collection. No evidence of pituitary lesion. Vascular: No vascular calcification.  No hyperdense vessels. Skull: Normal.  No fracture or focal bone lesion. Sinuses/Orbits: Visualized sinuses are  clear. No fluid in the middle ears or mastoids. Visualized orbits are normal. Other: None significant CT CERVICAL SPINE FINDINGS Alignment: Straightening of the normal cervical lordosis. Skull base and vertebrae: No fracture. Soft tissues and spinal canal: No significant soft tissue finding. Disc levels: Degenerative spondylosis at C3-4, C4-5, C5-6 and C6-7. Osteophytes but without likely compressive narrowing of the canal or foramina on the basis of that. Upper chest: Negative Other: None IMPRESSION: Head CT:  Normal. Cervical spine CT: No acute or traumatic finding. Mid cervical spondylosis which could be associated with neck pain. Electronically Signed   By: Paulina Fusi M.D.   On: 01/11/2017 19:38   Ct Cervical Spine Wo Contrast  Result Date: 01/11/2017 CLINICAL DATA:  Generalize weakness and blurred vision over the last 3 days. Fell with trauma to the head 1 week ago. Left neck pain. EXAM: CT HEAD WITHOUT CONTRAST CT CERVICAL SPINE  WITHOUT CONTRAST TECHNIQUE: Multidetector CT imaging of the head and cervical spine was performed following the standard protocol without intravenous contrast. Multiplanar CT image reconstructions of the cervical spine were also generated. COMPARISON:  11/04/2014 FINDINGS: CT HEAD FINDINGS Brain: No evidence of malformation, atrophy, old or acute small or large vessel infarction, mass lesion, hemorrhage, hydrocephalus or extra-axial collection. No evidence of pituitary lesion. Vascular: No vascular calcification.  No hyperdense vessels. Skull: Normal.  No fracture or focal bone lesion. Sinuses/Orbits: Visualized sinuses are clear. No fluid in the middle ears or mastoids. Visualized orbits are normal. Other: None significant CT CERVICAL SPINE FINDINGS Alignment: Straightening of the normal cervical lordosis. Skull base and vertebrae: No fracture. Soft tissues and spinal canal: No significant soft tissue finding. Disc levels: Degenerative spondylosis at C3-4, C4-5, C5-6 and C6-7. Osteophytes but without likely compressive narrowing of the canal or foramina on the basis of that. Upper chest: Negative Other: None IMPRESSION: Head CT:  Normal. Cervical spine CT: No acute or traumatic finding. Mid cervical spondylosis which could be associated with neck pain. Electronically Signed   By: Paulina Fusi M.D.   On: 01/11/2017 19:38    ____________________________________________   PROCEDURES  Procedure(s) performed:   Procedures  None ____________________________________________   INITIAL IMPRESSION / ASSESSMENT AND PLAN / ED COURSE  Pertinent labs & imaging results that were available during my care of the patient were reviewed by me and considered in my medical decision making (see chart for details).  Patient presents to the emergency department for evaluation of multiple medical complaints including total body pain, tingling, double vision. She is concerned regarding a head injury she sustained  approximately one week prior. She has no focal neurological deficits. She has no tenderness to palpation of the cervical spine. By history and exam is limited by the patient's cooperation. She is belligerent and at times difficult to redirect. She is tangential with her history. No signs of head or neck trauma. With limited exam, however, plan for CT scan of the head and neck along with lab work. Patient smells of alcohol and has a Telena Peyser history of alcohol and drug abuse. Finger stick glucose negative on arrival.   09:00 PM Patient has alcohol of 361. I discussed that her current clinical presentation is easily explained by this level of alcohol intoxication. The patient continued to act in a belligerent manner. A family member was at bedside who I also discussed the case with. I encouraged her to get help with her alcoholism and I suspect most of her current complaints would resolve. Patient became increasingly agitated and  walked out of the emergency department with her family member. She had a steady gait and a sober driver home. She did not accept discharge paperwork with a list of alcohol treatment facilities in the area.   At this time, I do not feel there is any life-threatening condition present. I have reviewed and discussed all results (EKG, imaging, lab, urine as appropriate), exam findings with patient. I have reviewed nursing notes and appropriate previous records.  I feel the patient is safe to be discharged home without further emergent workup. Discussed usual and customary return precautions. Patient and family (if present) verbalize understanding and are comfortable with this plan.  Patient will follow-up with their primary care provider. If they do not have a primary care provider, information for follow-up has been provided to them. All questions have been answered.  ____________________________________________  FINAL CLINICAL IMPRESSION(S) / ED DIAGNOSES  Final diagnoses:  Alcoholic  intoxication with complication (HCC)  Vision changes     MEDICATIONS GIVEN DURING THIS VISIT:  Medications - No data to display   NEW OUTPATIENT MEDICATIONS STARTED DURING THIS VISIT:  None   Note:  This document was prepared using Dragon voice recognition software and may include unintentional dictation errors.  Alona BeneJoshua Raydin Bielinski, MD Emergency Medicine    Zelda Reames, Arlyss RepressJoshua G, MD 01/11/17 2109

## 2017-01-11 NOTE — ED Notes (Signed)
CRITICAL VALUE ALERT  Critical Value:  Ethanol 361  Date & Time Notied:  01/11/17 2058  Provider Notified:   MD Long   Orders Received/Actions taken: none

## 2017-01-11 NOTE — ED Triage Notes (Addendum)
Patient has multiple complicates. Patient c/o generalized weakness and blurred vision x3 days. Per patient cramping sensation throughout body. Patient states that it started after she was mowing the yard and was sweating profusely. Patient also states she feel and hit her head at a swimming a week ago and has had left flank pain and left neck pain. Patient smells of ETOH and reports she drunk some this morning. Father states patient is smoking and wearing nicotine patches at the same time as well-which patient denies.

## 2017-01-11 NOTE — ED Notes (Signed)
Pt left AMA. No VS or e-signature obtained. MD aware.

## 2017-12-25 ENCOUNTER — Encounter (HOSPITAL_COMMUNITY): Payer: Self-pay | Admitting: *Deleted

## 2017-12-25 ENCOUNTER — Emergency Department (HOSPITAL_COMMUNITY)
Admission: EM | Admit: 2017-12-25 | Discharge: 2017-12-25 | Disposition: A | Discharge status: Home / Self Care | Payer: PRIVATE HEALTH INSURANCE | Attending: Emergency Medicine | Admitting: Emergency Medicine

## 2017-12-25 ENCOUNTER — Other Ambulatory Visit: Payer: Self-pay

## 2017-12-25 DIAGNOSIS — Z5321 Procedure and treatment not carried out due to patient leaving prior to being seen by health care provider: Secondary | ICD-10-CM | POA: Diagnosis not present

## 2017-12-25 DIAGNOSIS — F1010 Alcohol abuse, uncomplicated: Secondary | ICD-10-CM | POA: Insufficient documentation

## 2017-12-25 DIAGNOSIS — M791 Myalgia, unspecified site: Secondary | ICD-10-CM | POA: Insufficient documentation

## 2017-12-25 NOTE — ED Notes (Signed)
Security called to wand pt and pt's visitor

## 2017-12-25 NOTE — ED Triage Notes (Signed)
Pt states she is going through DT's due to ETOH, last drank at midnight.  Pt states chills and body aches.  Pt states she needs clearance for detox in [place].

## 2017-12-25 NOTE — ED Notes (Addendum)
Patient states that she does not want to be seen by physicians here. She wants to be treated in [place].  Patient then declined to be treated at this ED. She left without signing discharge.

## 2017-12-25 NOTE — ED Notes (Signed)
Pt denies SI at this time. 

## 2017-12-25 NOTE — ED Notes (Signed)
Advised patient's visitor that she had to take pocket book out to vehicle per security request. Verbalized understanding and took her pocket book and the patients pocket book to car. Patient states "NO! I need to go somewhere else and you need to get on the phone and get me sent there."

## 2018-07-24 DEATH — deceased
